# Patient Record
Sex: Female | Born: 1940 | Race: White | Hispanic: No | Marital: Married | Smoking: Never smoker
Health system: Southern US, Community
[De-identification: ages and names within clinical notes are randomized; demographics above are authoritative.]

## PROBLEM LIST (undated history)

## (undated) DIAGNOSIS — K5900 Constipation, unspecified: Secondary | ICD-10-CM

## (undated) DIAGNOSIS — N816 Rectocele: Secondary | ICD-10-CM

## (undated) DIAGNOSIS — I10 Essential (primary) hypertension: Secondary | ICD-10-CM

## (undated) HISTORY — DX: Constipation, unspecified: K59.00

## (undated) HISTORY — PX: TUBAL LIGATION: SHX77

## (undated) HISTORY — DX: Rectocele: N81.6

---

## 1997-10-14 ENCOUNTER — Other Ambulatory Visit: Admission: RE | Admit: 1997-10-14 | Discharge: 1997-10-14 | Payer: Self-pay | Admitting: *Deleted

## 1998-10-09 ENCOUNTER — Other Ambulatory Visit: Admission: RE | Admit: 1998-10-09 | Discharge: 1998-10-09 | Payer: Self-pay | Admitting: *Deleted

## 1999-10-22 ENCOUNTER — Other Ambulatory Visit: Admission: RE | Admit: 1999-10-22 | Discharge: 1999-10-22 | Payer: Self-pay | Admitting: *Deleted

## 2000-11-17 ENCOUNTER — Other Ambulatory Visit: Admission: RE | Admit: 2000-11-17 | Discharge: 2000-11-17 | Payer: Self-pay | Admitting: *Deleted

## 2011-07-05 ENCOUNTER — Encounter (INDEPENDENT_AMBULATORY_CARE_PROVIDER_SITE_OTHER): Payer: Self-pay | Admitting: *Deleted

## 2011-07-11 ENCOUNTER — Other Ambulatory Visit: Payer: Self-pay | Admitting: Adult Health

## 2011-07-11 ENCOUNTER — Other Ambulatory Visit (HOSPITAL_COMMUNITY)
Admission: RE | Admit: 2011-07-11 | Discharge: 2011-07-11 | Disposition: A | Discharge status: Home / Self Care | Payer: Medicare Other | Source: Ambulatory Visit | Attending: Obstetrics and Gynecology | Admitting: Obstetrics and Gynecology

## 2011-07-11 DIAGNOSIS — Z01419 Encounter for gynecological examination (general) (routine) without abnormal findings: Secondary | ICD-10-CM | POA: Insufficient documentation

## 2011-07-17 ENCOUNTER — Telehealth (INDEPENDENT_AMBULATORY_CARE_PROVIDER_SITE_OTHER): Payer: Self-pay | Admitting: *Deleted

## 2011-07-17 ENCOUNTER — Encounter (INDEPENDENT_AMBULATORY_CARE_PROVIDER_SITE_OTHER): Payer: Self-pay | Admitting: *Deleted

## 2011-07-17 ENCOUNTER — Other Ambulatory Visit (INDEPENDENT_AMBULATORY_CARE_PROVIDER_SITE_OTHER): Payer: Self-pay | Admitting: *Deleted

## 2011-07-17 DIAGNOSIS — Z1211 Encounter for screening for malignant neoplasm of colon: Secondary | ICD-10-CM

## 2011-07-17 MED ORDER — PEG-KCL-NACL-NASULF-NA ASC-C 100 G PO SOLR: 1.0000 | Freq: Once | ORAL | Status: DC

## 2011-07-17 NOTE — Telephone Encounter (Signed)
Patient needs movi prep 

## 2011-07-23 ENCOUNTER — Telehealth (INDEPENDENT_AMBULATORY_CARE_PROVIDER_SITE_OTHER): Payer: Self-pay | Admitting: *Deleted

## 2011-07-23 NOTE — Telephone Encounter (Signed)
PCP/Requesting MD: zack hall  Name & DOB: Terri Graham 2040/07/24     Procedure: tcs  Reason/Indication:  screening  Has patient had this procedure before?  no  If so, when, by whom and where?    Is there a family history of colon cancer?  no  Who?  What age when diagnosed?    Is patient diabetic?   no      Does patient have prosthetic heart valve?  no  Do you have a pacemaker?  no  Has patient had joint replacement within last 12 months?  no  Is patient on Coumadin, Plavix and/or Aspirin? yes  Medications: asa 81 mg daily, benicar 40 mg daily, crestor 40 mg daily, vitamins  Allergies: nkda  Medication Adjustment: asa 2 days  Procedure date & time: 08/22/11 at 1030

## 2011-07-25 NOTE — Telephone Encounter (Signed)
agree

## 2011-08-16 ENCOUNTER — Encounter (HOSPITAL_COMMUNITY): Payer: Self-pay | Admitting: Pharmacy Technician

## 2011-08-22 ENCOUNTER — Encounter (HOSPITAL_COMMUNITY)
Admission: RE | Disposition: A | Discharge status: Home / Self Care | Payer: Self-pay | Source: Ambulatory Visit | Attending: Internal Medicine

## 2011-08-22 ENCOUNTER — Telehealth (INDEPENDENT_AMBULATORY_CARE_PROVIDER_SITE_OTHER): Payer: Self-pay | Admitting: Internal Medicine

## 2011-08-22 ENCOUNTER — Ambulatory Visit (HOSPITAL_COMMUNITY)
Admission: RE | Admit: 2011-08-22 | Discharge: 2011-08-22 | Disposition: A | Discharge status: Home / Self Care | Payer: Medicare Other | Source: Ambulatory Visit | Attending: Internal Medicine | Admitting: Internal Medicine

## 2011-08-22 ENCOUNTER — Encounter (HOSPITAL_COMMUNITY): Payer: Self-pay | Admitting: *Deleted

## 2011-08-22 DIAGNOSIS — Z1211 Encounter for screening for malignant neoplasm of colon: Secondary | ICD-10-CM

## 2011-08-22 DIAGNOSIS — I10 Essential (primary) hypertension: Secondary | ICD-10-CM | POA: Insufficient documentation

## 2011-08-22 DIAGNOSIS — K644 Residual hemorrhoidal skin tags: Secondary | ICD-10-CM

## 2011-08-22 DIAGNOSIS — K5730 Diverticulosis of large intestine without perforation or abscess without bleeding: Secondary | ICD-10-CM | POA: Insufficient documentation

## 2011-08-22 DIAGNOSIS — Z79899 Other long term (current) drug therapy: Secondary | ICD-10-CM | POA: Insufficient documentation

## 2011-08-22 HISTORY — DX: Essential (primary) hypertension: I10

## 2011-08-22 HISTORY — PX: COLONOSCOPY: SHX5424

## 2011-08-22 SURGERY — COLONOSCOPY
Anesthesia: Moderate Sedation

## 2011-08-22 MED ORDER — MIDAZOLAM HCL 5 MG/5ML IJ SOLN
INTRAMUSCULAR | Status: AC
  Filled 2011-08-22: qty 10

## 2011-08-22 MED ORDER — STERILE WATER FOR IRRIGATION IR SOLN
Status: DC | PRN
  Administered 2011-08-22: 11:00:00

## 2011-08-22 MED ORDER — MEPERIDINE HCL 100 MG/ML IJ SOLN
INTRAMUSCULAR | Status: AC
  Filled 2011-08-22: qty 1

## 2011-08-22 MED ORDER — MEPERIDINE HCL 50 MG/ML IJ SOLN
INTRAMUSCULAR | Status: DC | PRN
  Administered 2011-08-22 (×2): 25 mg via INTRAVENOUS

## 2011-08-22 MED ORDER — ONDANSETRON HCL 4 MG/2ML IJ SOLN
INTRAMUSCULAR | Status: DC | PRN
  Administered 2011-08-22: 4 mg via INTRAVENOUS

## 2011-08-22 MED ORDER — ONDANSETRON HCL 4 MG/2ML IJ SOLN
INTRAMUSCULAR | Status: AC
  Filled 2011-08-22: qty 2

## 2011-08-22 MED ORDER — MEPERIDINE HCL 50 MG/ML IJ SOLN
INTRAMUSCULAR | Status: AC
  Filled 2011-08-22: qty 1

## 2011-08-22 MED ORDER — MIDAZOLAM HCL 5 MG/5ML IJ SOLN
INTRAMUSCULAR | Status: DC | PRN
  Administered 2011-08-22 (×2): 2 mg via INTRAVENOUS
  Administered 2011-08-22: 1 mg via INTRAVENOUS

## 2011-08-22 MED ORDER — SODIUM CHLORIDE 0.45 % IV SOLN
Freq: Once | INTRAVENOUS | Status: AC
  Administered 2011-08-22: 10:00:00 via INTRAVENOUS

## 2011-08-22 NOTE — H&P (Signed)
Terri Graham is an 71 y.o. female.   Chief Complaint: Patient is here for colonoscopy. HPI: Patient is 71 year old Caucasian female who is in for average risk screening colonoscopy. Patient denies abdominal pain change in her bowel habits or rectal bleeding. This is patient's first exam. Family history is negative for colorectal carcinoma.  Past Medical History  Diagnosis Date  . Hypertension     Past Surgical History  Procedure Date  . Tubal ligation     Family History  Problem Relation Age of Onset  . Colon cancer Neg Hx    Social History:  reports that she has never smoked. She does not have any smokeless tobacco history on file. She reports that she drinks alcohol. She reports that she does not use illicit drugs.  Allergies: No Known Allergies  Medications Prior to Admission  Medication Sig Dispense Refill  . aspirin EC 81 MG tablet Take 81 mg by mouth every evening.      . Calcium Carbonate-Vitamin D (CALCIUM + D PO) Take 1 tablet by mouth 2 (two) times daily.      Marland Kitchen CINNAMON PO Take 1 capsule by mouth daily.      . fish oil-omega-3 fatty acids 1000 MG capsule Take 1-2 g by mouth 2 (two) times daily. Takes 1 gm in the morning and 2 gm in the evening      . Multiple Vitamin (MULTIVITAMIN WITH MINERALS) TABS Take 1 tablet by mouth daily.      Marland Kitchen olmesartan (BENICAR) 40 MG tablet Take 40 mg by mouth every evening.      . peg 3350 powder (MOVIPREP) 100 G SOLR Take 1 kit (100 g total) by mouth once.  1 kit  0  . rosuvastatin (CRESTOR) 40 MG tablet Take 40 mg by mouth every evening.        No results found for this or any previous visit (from the past 48 hour(s)). No results found.  ROS  Blood pressure 153/85, pulse 100, temperature 97.8 F (36.6 C), temperature source Oral, resp. rate 18, height 5\' 2"  (1.575 m), weight 156 lb (70.761 kg), SpO2 97.00%. Physical Exam  Constitutional: She appears well-developed and well-nourished.  HENT:  Mouth/Throat: Oropharynx is  clear and moist.  Eyes: Conjunctivae are normal. No scleral icterus.  Neck: No thyromegaly present.  Cardiovascular: Normal rate, regular rhythm and normal heart sounds.   No murmur heard. Respiratory: Effort normal and breath sounds normal.  GI: Soft. She exhibits no distension and no mass. There is no tenderness.  Musculoskeletal: She exhibits no edema.  Lymphadenopathy:    She has no cervical adenopathy.  Skin: Skin is warm and dry.     Assessment/Plan Average risk screening colonoscopy.  Vaishnav Demartin U 08/22/2011, 10:52 AM

## 2011-08-22 NOTE — Telephone Encounter (Signed)
I called to check how patient was doing; she is still nauseated according to her husband. Script for Zofran 4 mg po q 6 prn called to [facility].

## 2011-08-22 NOTE — Discharge Instructions (Signed)
Resume usual medications. High fiber diet. No driving for 24 hours. Next screening exam in 10 years.Diverticulosis Diverticulosis is a common condition that develops when small pouches (diverticula) form in the wall of the colon. The risk of diverticulosis increases with age. It happens more often in people who eat a low-fiber diet. Most individuals with diverticulosis have no symptoms. Those individuals with symptoms usually experience abdominal pain, constipation, or loose stools (diarrhea). HOME CARE INSTRUCTIONS   Increase the amount of fiber in your diet as directed by your caregiver or dietician. This may reduce symptoms of diverticulosis.   Your caregiver may recommend taking a dietary fiber supplement.   Drink at least 6 to 8 glasses of water each day to prevent constipation.   Try not to strain when you have a bowel movement.   Your caregiver may recommend avoiding nuts and seeds to prevent complications, although this is still an uncertain benefit.   Only take over-the-counter or prescription medicines for pain, discomfort, or fever as directed by your caregiver.  FOODS WITH HIGH FIBER CONTENT INCLUDE:  Fruits. Apple, peach, pear, tangerine, raisins, prunes.   Vegetables. Brussels sprouts, asparagus, broccoli, cabbage, carrot, cauliflower, romaine lettuce, spinach, summer squash, tomato, winter squash, zucchini.   Starchy Vegetables. Baked beans, kidney beans, lima beans, split peas, lentils, potatoes (with skin).   Grains. Whole wheat bread, brown rice, bran flake cereal, plain oatmeal, white rice, shredded wheat, bran muffins.  SEEK IMMEDIATE MEDICAL CARE IF:   You develop increasing pain or severe bloating.   You have an oral temperature above 102 F (38.9 C), not controlled by medicine.   You develop vomiting or bowel movements that are bloody or black.  Document Released: 11/09/2003 Document Revised: 01/31/2011 Document Reviewed: 07/12/2009 [facility] Patient  Information 2012 [place], [place].Hemorrhoids Hemorrhoids are enlarged (dilated) veins around the rectum. There are 2 types of hemorrhoids, and the type of hemorrhoid is determined by its location. Internal hemorrhoids occur in the veins just inside the rectum.They are usually not painful, but they may bleed.However, they may poke through to the outside and become irritated and painful. External hemorrhoids involve the veins outside the anus and can be felt as a painful swelling or hard lump near the anus.They are often itchy and may crack and bleed. Sometimes clots will form in the veins. This makes them swollen and painful. These are called thrombosed hemorrhoids. CAUSES Causes of hemorrhoids include:  Pregnancy. This increases the pressure in the hemorrhoidal veins.   Constipation.   Straining to have a bowel movement.   Obesity.   Heavy lifting or other activity that caused you to strain.  TREATMENT Most of the time hemorrhoids improve in 1 to 2 weeks. However, if symptoms do not seem to be getting better or if you have a lot of rectal bleeding, your caregiver may perform a procedure to help make the hemorrhoids get smaller or remove them completely.Possible treatments include:  Rubber band ligation. A rubber band is placed at the base of the hemorrhoid to cut off the circulation.   Sclerotherapy. A chemical is injected to shrink the hemorrhoid.   Infrared light therapy. Tools are used to burn the hemorrhoid.   Hemorrhoidectomy. This is surgical removal of the hemorrhoid.  HOME CARE INSTRUCTIONS   Increase fiber in your diet. Ask your caregiver about using fiber supplements.   Drink enough water and fluids to keep your urine clear or pale yellow.   Exercise regularly.   Go to the bathroom when you have the  urge to have a bowel movement. Do not wait.   Avoid straining to have bowel movements.   Keep the anal area dry and clean.   Only take over-the-counter or prescription  medicines for pain, discomfort, or fever as directed by your caregiver.  If your hemorrhoids are thrombosed:  Take warm sitz baths for 20 to 30 minutes, 3 to 4 times per day.   If the hemorrhoids are very tender and swollen, place ice packs on the area as tolerated. Using ice packs between sitz baths may be helpful. Fill a plastic bag with ice. Place a towel between the bag of ice and your skin.   Medicated creams and suppositories may be used or applied as directed.   Do not use a donut-shaped pillow or sit on the toilet for long periods. This increases blood pooling and pain.  SEEK MEDICAL CARE IF:   You have increasing pain and swelling that is not controlled with your medicine.   You have uncontrolled bleeding.   You have difficulty or you are unable to have a bowel movement.   You have pain or inflammation outside the area of the hemorrhoids.   You have chills or an oral temperature above 102 F (38.9 C).  MAKE SURE YOU:   Understand these instructions.   Will watch your condition.   Will get help right away if you are not doing well or get worse.  Document Released: 02/09/2000 Document Revised: 01/31/2011 Document Reviewed: 06/16/2007 [employer] Patient Information 2012 [employer] Care After Read the instructions outlined below and refer to this sheet in the next few weeks. These discharge instructions provide you with general information on caring for yourself after you leave the hospital. Your doctor may also give you specific instructions. While your treatment has been planned according to the most current medical practices available, unavoidable complications occasionally occur. If you have any problems or questions after discharge, call your doctor. HOME CARE INSTRUCTIONS ACTIVITY:  You may resume your regular activity, but move at a slower pace for the next 24 hours.   Take frequent rest periods for the next 24 hours.   Walking will help get rid of  the air and reduce the bloated feeling in your belly (abdomen).   No driving for 24 hours (because of the medicine (anesthesia) used during the test).   You may shower.   Do not sign any important legal documents or operate any machinery for 24 hours (because of the anesthesia used during the test).  NUTRITION:  Drink plenty of fluids.   You may resume your normal diet as instructed by your doctor.   Begin with a light meal and progress to your normal diet. Heavy or fried foods are harder to digest and may make you feel sick to your stomach (nauseated).   Avoid alcoholic beverages for 24 hours or as instructed.  MEDICATIONS:  You may resume your normal medications unless your doctor tells you otherwise.  WHAT TO EXPECT TODAY:  Some feelings of bloating in the abdomen.   Passage of more gas than usual.   Spotting of blood in your stool or on the toilet paper.  IF YOU HAD POLYPS REMOVED DURING THE COLONOSCOPY:  No aspirin products for 7 days or as instructed.   No alcohol for 7 days or as instructed.   Eat a soft diet for the next 24 hours.  FINDING OUT THE RESULTS OF YOUR TEST Not all test results are available during your visit. If your  test results are not back during the visit, make an appointment with your caregiver to find out the results. Do not assume everything is normal if you have not heard from your caregiver or the medical facility. It is important for you to follow up on all of your test results.  SEEK IMMEDIATE MEDICAL CARE IF:  You have more than a spotting of blood in your stool.   Your belly is swollen (abdominal distention).   You are nauseated or vomiting.   You have a fever.   You have abdominal pain or discomfort that is severe or gets worse throughout the day.  Document Released: 09/26/2003 Document Revised: 01/31/2011 Document Reviewed: 09/24/2007 [facility] Patient Information 2012 [place], [place].Marland Kitchen

## 2011-08-22 NOTE — Op Note (Signed)
COLONOSCOPY PROCEDURE REPORT  PATIENT:  Terri Graham  MR#:  [id number] Birthdate:  08/19/1940, 71 y.o., female Endoscopist:  Dr. Malissa Hippo, MD Referred By:  Dr. Catalina Pizza, MD Procedure Date: 08/22/2011  Procedure:   Colonoscopy  Indications: Patient is 71 year old Caucasian female was undergoing average risk screening colonoscopy.  Informed Consent:  The procedure and risks were reviewed with the patient and informed consent was obtained.  Medications:  Demerol 50 mg IV Versed 5 mg IV Patient was also given Zofran 4 mg IV for nausea post procedure.  Description of procedure:  After a digital rectal exam was performed, that colonoscope was advanced from the anus through the rectum and colon to the area of the cecum, ileocecal valve and appendiceal orifice. The cecum was deeply intubated. These structures were well-seen and photographed for the record. From the level of the cecum and ileocecal valve, the scope was slowly and cautiously withdrawn. The mucosal surfaces were carefully surveyed utilizing scope tip to flexion to facilitate fold flattening as needed. The scope was pulled down into the rectum where a thorough exam including retroflexion was performed.  Findings:   Prep excellent. Few diverticula at sigmoid colon and one at hepatic flexure. No polyps or other mucosal abnormalities identified. Normal rectal mucosa. Small hemorrhoids below the dentate line.  Therapeutic/Diagnostic Maneuvers Performed:  None  Complications:  None  Cecal Withdrawal Time:  7 minutes  Impression:  Examination performed to cecum. Few diverticula at sigmoid colon and one at hepatic flexure. Small external hemorrhoids.  Recommendations:  Standard instructions given. Next screening exam in 10 years.  [name]  08/22/2011 11:33 AM  CC: Dr. Dwana Melena, MD & Dr. Bonnetta Barry ref. provider found

## 2011-08-22 NOTE — Progress Notes (Signed)
Pt vomited mod amt clear liquid. States she feels better afterward. Dr Karilyn Cota notified. States pt can be discharged.

## 2011-08-23 ENCOUNTER — Encounter (HOSPITAL_COMMUNITY): Payer: Self-pay | Admitting: Internal Medicine

## 2012-07-28 ENCOUNTER — Encounter: Payer: Self-pay | Admitting: *Deleted

## 2012-07-29 ENCOUNTER — Encounter: Payer: Self-pay | Admitting: Adult Health

## 2012-07-29 ENCOUNTER — Ambulatory Visit (INDEPENDENT_AMBULATORY_CARE_PROVIDER_SITE_OTHER): Payer: Medicare Other | Admitting: Adult Health

## 2012-07-29 VITALS — BP 140/72 | HR 74 | Ht 61.0 in | Wt 159.5 lb

## 2012-07-29 DIAGNOSIS — I10 Essential (primary) hypertension: Secondary | ICD-10-CM

## 2012-07-29 DIAGNOSIS — K5900 Constipation, unspecified: Secondary | ICD-10-CM

## 2012-07-29 DIAGNOSIS — Z1212 Encounter for screening for malignant neoplasm of rectum: Secondary | ICD-10-CM

## 2012-07-29 DIAGNOSIS — Z01419 Encounter for gynecological examination (general) (routine) without abnormal findings: Secondary | ICD-10-CM

## 2012-07-29 DIAGNOSIS — N816 Rectocele: Secondary | ICD-10-CM

## 2012-07-29 HISTORY — DX: Constipation, unspecified: K59.00

## 2012-07-29 HISTORY — DX: Rectocele: N81.6

## 2012-07-29 LAB — HEMOCCULT GUIAC POC 1CARD (OFFICE): Fecal Occult Blood, POC: NEGATIVE

## 2012-07-29 NOTE — Progress Notes (Signed)
Patient ID: Terri Graham, female   DOB: 1940-09-10, 72 y.o.   MRN: [id number] History of Present Illness: Terri Graham is a 72 year old white female married, in for her physical. She had a colonoscopy with Dr. Karilyn Cota last year.  Current Medications, Allergies, Past Medical History, Past Surgical History, Family History and Social History were reviewed in [employer] record.     Review of Systems: Patient denies any headaches, blurred vision, shortness of breath, chest pain, abdominal pain, problems with urination, or intercourse. She has constipation at times and uses miralax.No joint pain or mood changes.    Physical Exam:BP 140/72  Pulse 74  Ht 5\' 1"  (1.549 m)  Wt 159 lb 8 oz (72.349 kg)  BMI 30.15 kg/m2 General:  Well developed, well nourished, no acute distress Skin:  Warm and dry Neck:  Midline trachea, normal thyroid, no carotid bruits heard Lungs; Clear to auscultation bilaterally Breast:  No dominant palpable mass, retraction, or nipple discharge Cardiovascular: Regular rate and rhythm Abdomen:  Soft, non tender, no hepatosplenomegaly Pelvic:  External genitalia is normal in appearance for her age.  The vagina has decrease color, moisture and rugae.The cervix is atrophic.  Uterus is felt to be normal size, shape, and contour.  No        adnexal masses or tenderness noted. Rectal: Good sphincter tone, no polyps, or hemorrhoids felt.  Hemoccult negative.She has a rectocele Extremities:  No swelling  Noted, she has some spider veins Psych:  Alert and cooperative, seems happy   Impression: Yearly GYN exam- no pap Rectocele Hypertension Constipation Plan: Physical with pap and HPV next year Mammogram yearly Labs as per PCP

## 2012-07-29 NOTE — Patient Instructions (Addendum)
Physical and pap next year

## 2013-08-03 ENCOUNTER — Encounter: Payer: Self-pay | Admitting: Adult Health

## 2013-08-03 ENCOUNTER — Other Ambulatory Visit (HOSPITAL_COMMUNITY)
Admission: RE | Admit: 2013-08-03 | Discharge: 2013-08-03 | Disposition: A | Discharge status: Home / Self Care | Payer: Medicare Other | Source: Ambulatory Visit | Attending: Family Medicine | Admitting: Family Medicine

## 2013-08-03 ENCOUNTER — Ambulatory Visit (INDEPENDENT_AMBULATORY_CARE_PROVIDER_SITE_OTHER): Payer: Medicare Other | Admitting: Adult Health

## 2013-08-03 VITALS — BP 118/64 | HR 74 | Ht 62.0 in | Wt 160.0 lb

## 2013-08-03 DIAGNOSIS — Z1151 Encounter for screening for human papillomavirus (HPV): Secondary | ICD-10-CM | POA: Insufficient documentation

## 2013-08-03 DIAGNOSIS — Z1212 Encounter for screening for malignant neoplasm of rectum: Secondary | ICD-10-CM

## 2013-08-03 DIAGNOSIS — Z01419 Encounter for gynecological examination (general) (routine) without abnormal findings: Secondary | ICD-10-CM

## 2013-08-03 LAB — HEMOCCULT GUIAC POC 1CARD (OFFICE): Fecal Occult Blood, POC: NEGATIVE

## 2013-08-03 NOTE — Patient Instructions (Signed)
Physical in 2 years Mammogram yearly  Colonoscopy in 8 years Labs with PCP

## 2013-08-03 NOTE — Progress Notes (Signed)
Patient ID: Terri Graham, female   DOB: Apr 13, 1940, 73 y.o.   MRN: [id number] History of Present Illness: Terri Graham is a 73 year old white female in for pap and physical. She has no complaints.  Current Medications, Allergies, Past Medical History, Past Surgical History, Family History and Social History were reviewed in [employer] record.     Review of Systems: Patient denies any headaches, blurred vision, shortness of breath, chest pain, abdominal pain, problems with bowel movements, urination, or intercourse. 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    Physical Exam:BP 118/64  Pulse 74  Ht 5\' 2"  (1.575 m)  Wt 160 lb (72.576 kg)  BMI 29.26 kg/m2 General:  Well developed, well nourished, no acute distress Skin:  Warm and dry Neck:  Midline trachea, normal thyroid Lungs; Clear to auscultation bilaterally Breast:  No dominant palpable mass, retraction, or nipple discharge Cardiovascular: Regular rate and rhythm Abdomen:  Soft, non tender, no hepatosplenomegaly Pelvic:  External genitalia is normal in appearance for age.  The vagina has loss of color, moisture and rugae. The cervix is atrophic, pap performed with HPV.Marland Kitchen  Uterus is felt to be normal size, shape, and contour.  No   adnexal masses or tenderness noted. Rectal: Good sphincter tone, no polyps, or hemorrhoids felt.  Hemoccult negative.+hemocult. Extremities:  No swelling  noted Psych:  No mood changes, alert and cooperative,seems happy   Impression: Yearly gyn exam    Plan: Physical in 2 year Mammogram yearly Colonoscopy in 8 years Labs with PCP

## 2013-08-05 LAB — CYTOLOGY - PAP

## 2013-12-03 ENCOUNTER — Other Ambulatory Visit (HOSPITAL_COMMUNITY): Payer: Self-pay | Admitting: Internal Medicine

## 2013-12-03 DIAGNOSIS — Z13820 Encounter for screening for osteoporosis: Secondary | ICD-10-CM

## 2013-12-08 ENCOUNTER — Inpatient Hospital Stay (HOSPITAL_COMMUNITY): Admission: RE | Admit: 2013-12-08 | Payer: Medicare Other | Source: Ambulatory Visit

## 2013-12-27 ENCOUNTER — Encounter: Payer: Self-pay | Admitting: Adult Health

## 2015-03-10 DIAGNOSIS — R7301 Impaired fasting glucose: Secondary | ICD-10-CM | POA: Diagnosis not present

## 2015-03-10 DIAGNOSIS — I10 Essential (primary) hypertension: Secondary | ICD-10-CM | POA: Diagnosis not present

## 2015-03-10 DIAGNOSIS — E782 Mixed hyperlipidemia: Secondary | ICD-10-CM | POA: Diagnosis not present

## 2015-03-15 DIAGNOSIS — I10 Essential (primary) hypertension: Secondary | ICD-10-CM | POA: Diagnosis not present

## 2015-03-15 DIAGNOSIS — E782 Mixed hyperlipidemia: Secondary | ICD-10-CM | POA: Diagnosis not present

## 2015-03-15 DIAGNOSIS — R7301 Impaired fasting glucose: Secondary | ICD-10-CM | POA: Diagnosis not present

## 2015-03-15 DIAGNOSIS — R944 Abnormal results of kidney function studies: Secondary | ICD-10-CM | POA: Diagnosis not present

## 2015-08-10 ENCOUNTER — Ambulatory Visit (INDEPENDENT_AMBULATORY_CARE_PROVIDER_SITE_OTHER): Payer: Medicare HMO | Admitting: Adult Health

## 2015-08-10 ENCOUNTER — Encounter: Payer: Self-pay | Admitting: Adult Health

## 2015-08-10 VITALS — BP 164/90 | HR 78 | Ht 61.0 in | Wt 147.5 lb

## 2015-08-10 DIAGNOSIS — Z1211 Encounter for screening for malignant neoplasm of colon: Secondary | ICD-10-CM | POA: Diagnosis not present

## 2015-08-10 DIAGNOSIS — Z01419 Encounter for gynecological examination (general) (routine) without abnormal findings: Secondary | ICD-10-CM

## 2015-08-10 DIAGNOSIS — I10 Essential (primary) hypertension: Secondary | ICD-10-CM | POA: Diagnosis not present

## 2015-08-10 DIAGNOSIS — Z01411 Encounter for gynecological examination (general) (routine) with abnormal findings: Secondary | ICD-10-CM | POA: Diagnosis not present

## 2015-08-10 LAB — HEMOCCULT GUIAC POC 1CARD (OFFICE): Fecal Occult Blood, POC: NEGATIVE

## 2015-08-10 NOTE — Progress Notes (Signed)
Patient ID: Terri Graham, female   DOB: 1941-01-07, 75 y.o.   MRN: [id number] History of Present Illness: Terri Graham is a 75 year old white female, married in for a well woman gyn exam, she had a normal pap with negative HPV.She has not taken BP meds in about 5 months. PCP is Z. Hall.  Current Medications, Allergies, Past Medical History, Past Surgical History, Family History and Social History were [facility] in [facility].     Review of Systems: Patient denies any headaches, hearing loss, fatigue, blurred vision, shortness of breath, chest pain, abdominal pain, problems with bowel movements, urination, or intercourse(not often). No joint pain or mood swings.    Physical Exam:BP 164/90 mmHg  Pulse 78  Ht 5\' 1"  (1.549 m)  Wt 147 lb 8 oz (66.906 kg)  BMI 27.88 kg/m2 General:  Well developed, well nourished, no acute distress Skin:  Warm and dry Neck:  Midline trachea, normal thyroid, good ROM, no lymphadenopathy,No carotid bruits heard Lungs; Clear to auscultation bilaterally Breast:  No dominant palpable mass, retraction, or nipple discharge Cardiovascular: Regular rate and rhythm Abdomen:  Soft, non tender, no hepatosplenomegaly Pelvic:  External genitalia is normal in appearance, no lesions.  The vagina has decreased color, moisture and rugae. Urethra has no lesions or masses. The cervix is smooth.  Uterus is felt to be normal size, shape, and contour.  No adnexal masses or tenderness noted.Bladder is non tender, no masses felt. Rectal: Good sphincter tone, no polyps, or hemorrhoids felt.  Hemoccult negative. Extremities/musculoskeletal:  No swelling or varicosities noted, no clubbing or cyanosis Psych:  No mood changes, alert and cooperative,seems happy   Impression: Well woman gyn exam no pap Hypertension     Plan: Follow up with PCP about BP Physical in 2 years, no more paps Mammogram yearly Colonoscopy per GI Labs with PCP

## 2015-08-10 NOTE — Patient Instructions (Signed)
Physical in 2 years Mammogram yearly  Labs with PCP 

## 2015-09-13 DIAGNOSIS — E782 Mixed hyperlipidemia: Secondary | ICD-10-CM | POA: Diagnosis not present

## 2015-09-13 DIAGNOSIS — R7301 Impaired fasting glucose: Secondary | ICD-10-CM | POA: Diagnosis not present

## 2015-09-15 DIAGNOSIS — I10 Essential (primary) hypertension: Secondary | ICD-10-CM | POA: Diagnosis not present

## 2015-09-15 DIAGNOSIS — E782 Mixed hyperlipidemia: Secondary | ICD-10-CM | POA: Diagnosis not present

## 2015-09-15 DIAGNOSIS — R7301 Impaired fasting glucose: Secondary | ICD-10-CM | POA: Diagnosis not present

## 2016-02-09 DIAGNOSIS — Z1231 Encounter for screening mammogram for malignant neoplasm of breast: Secondary | ICD-10-CM | POA: Diagnosis not present

## 2016-02-09 DIAGNOSIS — Z803 Family history of malignant neoplasm of breast: Secondary | ICD-10-CM | POA: Diagnosis not present

## 2016-03-15 DIAGNOSIS — I10 Essential (primary) hypertension: Secondary | ICD-10-CM | POA: Diagnosis not present

## 2016-03-15 DIAGNOSIS — R7301 Impaired fasting glucose: Secondary | ICD-10-CM | POA: Diagnosis not present

## 2016-03-19 DIAGNOSIS — I10 Essential (primary) hypertension: Secondary | ICD-10-CM | POA: Diagnosis not present

## 2016-03-19 DIAGNOSIS — R7301 Impaired fasting glucose: Secondary | ICD-10-CM | POA: Diagnosis not present

## 2016-03-19 DIAGNOSIS — E782 Mixed hyperlipidemia: Secondary | ICD-10-CM | POA: Diagnosis not present

## 2016-03-19 DIAGNOSIS — R12 Heartburn: Secondary | ICD-10-CM | POA: Diagnosis not present

## 2016-05-15 DIAGNOSIS — E876 Hypokalemia: Secondary | ICD-10-CM | POA: Diagnosis not present

## 2016-05-15 DIAGNOSIS — Z0000 Encounter for general adult medical examination without abnormal findings: Secondary | ICD-10-CM | POA: Diagnosis not present

## 2016-05-15 DIAGNOSIS — E7800 Pure hypercholesterolemia, unspecified: Secondary | ICD-10-CM | POA: Diagnosis not present

## 2016-05-15 DIAGNOSIS — Z79899 Other long term (current) drug therapy: Secondary | ICD-10-CM | POA: Diagnosis not present

## 2016-05-15 DIAGNOSIS — R609 Edema, unspecified: Secondary | ICD-10-CM | POA: Diagnosis not present

## 2016-09-04 DIAGNOSIS — R7301 Impaired fasting glucose: Secondary | ICD-10-CM | POA: Diagnosis not present

## 2016-09-04 DIAGNOSIS — I10 Essential (primary) hypertension: Secondary | ICD-10-CM | POA: Diagnosis not present

## 2016-09-30 DIAGNOSIS — E782 Mixed hyperlipidemia: Secondary | ICD-10-CM | POA: Diagnosis not present

## 2016-09-30 DIAGNOSIS — I10 Essential (primary) hypertension: Secondary | ICD-10-CM | POA: Diagnosis not present

## 2016-09-30 DIAGNOSIS — R7301 Impaired fasting glucose: Secondary | ICD-10-CM | POA: Diagnosis not present

## 2016-09-30 DIAGNOSIS — Z6826 Body mass index (BMI) 26.0-26.9, adult: Secondary | ICD-10-CM | POA: Diagnosis not present

## 2016-09-30 DIAGNOSIS — K219 Gastro-esophageal reflux disease without esophagitis: Secondary | ICD-10-CM | POA: Diagnosis not present

## 2017-02-11 DIAGNOSIS — Z803 Family history of malignant neoplasm of breast: Secondary | ICD-10-CM | POA: Diagnosis not present

## 2017-02-11 DIAGNOSIS — Z1231 Encounter for screening mammogram for malignant neoplasm of breast: Secondary | ICD-10-CM | POA: Diagnosis not present

## 2017-04-02 DIAGNOSIS — E782 Mixed hyperlipidemia: Secondary | ICD-10-CM | POA: Diagnosis not present

## 2017-04-02 DIAGNOSIS — I10 Essential (primary) hypertension: Secondary | ICD-10-CM | POA: Diagnosis not present

## 2017-04-02 DIAGNOSIS — R7301 Impaired fasting glucose: Secondary | ICD-10-CM | POA: Diagnosis not present

## 2017-04-04 DIAGNOSIS — Z6828 Body mass index (BMI) 28.0-28.9, adult: Secondary | ICD-10-CM | POA: Diagnosis not present

## 2017-04-04 DIAGNOSIS — K219 Gastro-esophageal reflux disease without esophagitis: Secondary | ICD-10-CM | POA: Diagnosis not present

## 2017-04-04 DIAGNOSIS — R600 Localized edema: Secondary | ICD-10-CM | POA: Diagnosis not present

## 2017-04-04 DIAGNOSIS — D72819 Decreased white blood cell count, unspecified: Secondary | ICD-10-CM | POA: Diagnosis not present

## 2017-04-04 DIAGNOSIS — R7301 Impaired fasting glucose: Secondary | ICD-10-CM | POA: Diagnosis not present

## 2017-04-04 DIAGNOSIS — E785 Hyperlipidemia, unspecified: Secondary | ICD-10-CM | POA: Diagnosis not present

## 2017-08-13 ENCOUNTER — Ambulatory Visit: Payer: Medicare HMO | Admitting: Adult Health

## 2017-08-13 ENCOUNTER — Encounter (INDEPENDENT_AMBULATORY_CARE_PROVIDER_SITE_OTHER): Payer: Self-pay

## 2017-08-13 ENCOUNTER — Encounter: Payer: Self-pay | Admitting: Adult Health

## 2017-08-13 VITALS — BP 168/88 | HR 84 | Ht 60.5 in | Wt 157.5 lb

## 2017-08-13 DIAGNOSIS — N816 Rectocele: Secondary | ICD-10-CM

## 2017-08-13 DIAGNOSIS — Z01411 Encounter for gynecological examination (general) (routine) with abnormal findings: Secondary | ICD-10-CM | POA: Diagnosis not present

## 2017-08-13 DIAGNOSIS — I10 Essential (primary) hypertension: Secondary | ICD-10-CM

## 2017-08-13 DIAGNOSIS — Z1212 Encounter for screening for malignant neoplasm of rectum: Secondary | ICD-10-CM

## 2017-08-13 DIAGNOSIS — Z01419 Encounter for gynecological examination (general) (routine) without abnormal findings: Secondary | ICD-10-CM | POA: Insufficient documentation

## 2017-08-13 DIAGNOSIS — Z1211 Encounter for screening for malignant neoplasm of colon: Secondary | ICD-10-CM | POA: Diagnosis not present

## 2017-08-13 LAB — HEMOCCULT GUIAC POC 1CARD (OFFICE): Fecal Occult Blood, POC: NEGATIVE

## 2017-08-13 NOTE — Progress Notes (Signed)
Patient ID: Terri Graham, female   DOB: [birth year], 77 y.o.   MRN: [id number] History of Present Illness: Terri Graham is a 77 year old white female,married, PM in for a well woman gyn exam, she longer gets pap smears, last one was 08/03/13 and was negative for malignancy and HPV. She volunteers at [employer]. PCP is Dr Terri Graham.    Current Medications, Allergies, Past Medical History, Past Surgical History, Family History and Social History were reviewed in [facility] electronic medical record.     Review of Systems: Patient denies any headaches, hearing loss, fatigue, blurred vision, shortness of breath, chest pain, abdominal pain, problems with bowel movements, urination, or intercourse(not having sex). No joint pain or mood swings. She says BP good at home.   Physical Exam:BP (!) 168/88 (BP Location: Left Arm, Patient Position: Sitting, Cuff Size: Normal)   Pulse 84   Ht 5' 0.5" (1.537 m)   Wt 157 lb 8 oz (71.4 kg)   BMI 30.25 kg/m  General:  Well developed, well nourished, no acute distress Skin:  Warm and dry Neck:  Midline trachea, normal thyroid, good ROM, no lymphadenopathy, no carotid bruits heard Lungs; Clear to auscultation bilaterally Breast:  No dominant palpable mass, retraction, or nipple discharge Cardiovascular: Regular rate and rhythm Abdomen:  Soft, non tender, no hepatosplenomegaly Pelvic:  External genitalia is normal in appearance, no lesions.  The vagina is normal in appearance, for age with loss of color, moisture and rugae. Urethra has no lesions or masses. The cervix is atropic.  Uterus is felt to be normal size, shape, and contour.  No adnexal masses or tenderness noted.Bladder is non tender, no masses felt. Rectal: Good sphincter tone, no polyps, or hemorrhoids felt.  Hemoccult negative.+rectocele. Extremities/musculoskeletal:  No swelling or varicosities noted, no clubbing or cyanosis Psych:  No mood changes, alert and cooperative,seems happy PHQ  2 score 0.  Impression: 1. Encounter for well woman exam with routine gynecological exam   2. Screening for colorectal cancer   3. Essential hypertension   4. Rectocele       Plan: F/U with PCP on BP Physical in 2 years if desired Mammogram yearly Labs with PCP

## 2017-10-01 DIAGNOSIS — E785 Hyperlipidemia, unspecified: Secondary | ICD-10-CM | POA: Diagnosis not present

## 2017-10-01 DIAGNOSIS — D72819 Decreased white blood cell count, unspecified: Secondary | ICD-10-CM | POA: Diagnosis not present

## 2017-10-01 DIAGNOSIS — R7301 Impaired fasting glucose: Secondary | ICD-10-CM | POA: Diagnosis not present

## 2017-10-03 DIAGNOSIS — E782 Mixed hyperlipidemia: Secondary | ICD-10-CM | POA: Diagnosis not present

## 2017-10-03 DIAGNOSIS — D72819 Decreased white blood cell count, unspecified: Secondary | ICD-10-CM | POA: Diagnosis not present

## 2017-10-03 DIAGNOSIS — Z6828 Body mass index (BMI) 28.0-28.9, adult: Secondary | ICD-10-CM | POA: Diagnosis not present

## 2017-10-03 DIAGNOSIS — K219 Gastro-esophageal reflux disease without esophagitis: Secondary | ICD-10-CM | POA: Diagnosis not present

## 2017-10-03 DIAGNOSIS — R7301 Impaired fasting glucose: Secondary | ICD-10-CM | POA: Diagnosis not present

## 2017-10-03 DIAGNOSIS — R600 Localized edema: Secondary | ICD-10-CM | POA: Diagnosis not present

## 2018-02-12 DIAGNOSIS — Z803 Family history of malignant neoplasm of breast: Secondary | ICD-10-CM | POA: Diagnosis not present

## 2018-02-12 DIAGNOSIS — Z1231 Encounter for screening mammogram for malignant neoplasm of breast: Secondary | ICD-10-CM | POA: Diagnosis not present

## 2018-04-15 DIAGNOSIS — R7301 Impaired fasting glucose: Secondary | ICD-10-CM | POA: Diagnosis not present

## 2018-04-15 DIAGNOSIS — E782 Mixed hyperlipidemia: Secondary | ICD-10-CM | POA: Diagnosis not present

## 2018-04-15 DIAGNOSIS — E785 Hyperlipidemia, unspecified: Secondary | ICD-10-CM | POA: Diagnosis not present

## 2018-04-22 DIAGNOSIS — R7301 Impaired fasting glucose: Secondary | ICD-10-CM | POA: Diagnosis not present

## 2018-04-22 DIAGNOSIS — K219 Gastro-esophageal reflux disease without esophagitis: Secondary | ICD-10-CM | POA: Diagnosis not present

## 2018-04-22 DIAGNOSIS — D72819 Decreased white blood cell count, unspecified: Secondary | ICD-10-CM | POA: Diagnosis not present

## 2018-04-22 DIAGNOSIS — R600 Localized edema: Secondary | ICD-10-CM | POA: Diagnosis not present

## 2018-04-22 DIAGNOSIS — E782 Mixed hyperlipidemia: Secondary | ICD-10-CM | POA: Diagnosis not present

## 2018-06-12 DIAGNOSIS — Z0000 Encounter for general adult medical examination without abnormal findings: Secondary | ICD-10-CM | POA: Diagnosis not present

## 2018-10-14 DIAGNOSIS — R7301 Impaired fasting glucose: Secondary | ICD-10-CM | POA: Diagnosis not present

## 2018-10-14 DIAGNOSIS — E782 Mixed hyperlipidemia: Secondary | ICD-10-CM | POA: Diagnosis not present

## 2018-10-14 DIAGNOSIS — E785 Hyperlipidemia, unspecified: Secondary | ICD-10-CM | POA: Diagnosis not present

## 2018-10-21 DIAGNOSIS — D72819 Decreased white blood cell count, unspecified: Secondary | ICD-10-CM | POA: Diagnosis not present

## 2018-10-21 DIAGNOSIS — R7303 Prediabetes: Secondary | ICD-10-CM | POA: Diagnosis not present

## 2018-10-21 DIAGNOSIS — R7301 Impaired fasting glucose: Secondary | ICD-10-CM | POA: Diagnosis not present

## 2018-10-21 DIAGNOSIS — E782 Mixed hyperlipidemia: Secondary | ICD-10-CM | POA: Diagnosis not present

## 2018-10-21 DIAGNOSIS — E663 Overweight: Secondary | ICD-10-CM | POA: Diagnosis not present

## 2018-10-21 DIAGNOSIS — R600 Localized edema: Secondary | ICD-10-CM | POA: Diagnosis not present

## 2018-10-21 DIAGNOSIS — H40013 Open angle with borderline findings, low risk, bilateral: Secondary | ICD-10-CM | POA: Diagnosis not present

## 2018-10-21 DIAGNOSIS — K219 Gastro-esophageal reflux disease without esophagitis: Secondary | ICD-10-CM | POA: Diagnosis not present

## 2018-11-16 DIAGNOSIS — R69 Illness, unspecified: Secondary | ICD-10-CM | POA: Diagnosis not present

## 2018-11-21 ENCOUNTER — Emergency Department (HOSPITAL_COMMUNITY)
Admission: EM | Admit: 2018-11-21 | Discharge: 2018-11-22 | Disposition: A | Discharge status: Home / Self Care | Payer: Medicare HMO | Attending: Emergency Medicine | Admitting: Emergency Medicine

## 2018-11-21 ENCOUNTER — Encounter (HOSPITAL_COMMUNITY): Payer: Self-pay | Admitting: Emergency Medicine

## 2018-11-21 ENCOUNTER — Emergency Department (HOSPITAL_COMMUNITY): Payer: Medicare HMO

## 2018-11-21 ENCOUNTER — Other Ambulatory Visit: Payer: Self-pay

## 2018-11-21 DIAGNOSIS — R42 Dizziness and giddiness: Secondary | ICD-10-CM | POA: Diagnosis not present

## 2018-11-21 DIAGNOSIS — Z79899 Other long term (current) drug therapy: Secondary | ICD-10-CM | POA: Diagnosis not present

## 2018-11-21 DIAGNOSIS — I10 Essential (primary) hypertension: Secondary | ICD-10-CM | POA: Insufficient documentation

## 2018-11-21 DIAGNOSIS — I6523 Occlusion and stenosis of bilateral carotid arteries: Secondary | ICD-10-CM | POA: Diagnosis not present

## 2018-11-21 DIAGNOSIS — R030 Elevated blood-pressure reading, without diagnosis of hypertension: Secondary | ICD-10-CM

## 2018-11-21 DIAGNOSIS — R112 Nausea with vomiting, unspecified: Secondary | ICD-10-CM | POA: Diagnosis not present

## 2018-11-21 DIAGNOSIS — Z7982 Long term (current) use of aspirin: Secondary | ICD-10-CM | POA: Insufficient documentation

## 2018-11-21 LAB — COMPREHENSIVE METABOLIC PANEL
ALT: 39 U/L (ref 0–44)
AST: 32 U/L (ref 15–41)
Albumin: 4.6 g/dL (ref 3.5–5.0)
Alkaline Phosphatase: 62 U/L (ref 38–126)
Anion gap: 13 (ref 5–15)
BUN: 23 mg/dL (ref 8–23)
CO2: 21 mmol/L — ABNORMAL LOW (ref 22–32)
Calcium: 9.2 mg/dL (ref 8.9–10.3)
Chloride: 100 mmol/L (ref 98–111)
Creatinine, Ser: 0.78 mg/dL (ref 0.44–1.00)
GFR calc Af Amer: >60 mL/min (ref >60)
GFR calc non Af Amer: >60 mL/min (ref >60)
Glucose, Bld: 177 mg/dL — ABNORMAL HIGH (ref 70–99)
Potassium: 3.5 mmol/L (ref 3.5–5.1)
Sodium: 134 mmol/L — ABNORMAL LOW (ref 135–145)
Total Bilirubin: 0.5 mg/dL (ref 0.3–1.2)
Total Protein: 8.2 g/dL — ABNORMAL HIGH (ref 6.5–8.1)

## 2018-11-21 LAB — CBC
HCT: 42 % (ref 36.0–46.0)
Hemoglobin: 13.4 g/dL (ref 12.0–15.0)
MCH: 30.5 pg (ref 26.0–34.0)
MCHC: 31.9 g/dL (ref 30.0–36.0)
MCV: 95.5 fL (ref 80.0–100.0)
Platelets: 341 10*3/uL (ref 150–400)
RBC: 4.4 MIL/uL (ref 3.87–5.11)
RDW: 13.9 % (ref 11.5–15.5)
WBC: 8 10*3/uL (ref 4.0–10.5)
nRBC: 0 % (ref 0.0–0.2)

## 2018-11-21 LAB — URINALYSIS, ROUTINE W REFLEX MICROSCOPIC
Bacteria, UA: NONE SEEN
Bilirubin Urine: NEGATIVE
Glucose, UA: 150 mg/dL — AB
Hgb urine dipstick: NEGATIVE
Ketones, ur: NEGATIVE mg/dL
Leukocytes,Ua: NEGATIVE
Nitrite: NEGATIVE
Protein, ur: 100 mg/dL — AB
Specific Gravity, Urine: 1.017 (ref 1.005–1.030)
pH: 7 (ref 5.0–8.0)

## 2018-11-21 LAB — LIPASE, BLOOD: Lipase: 69 U/L — ABNORMAL HIGH (ref 11–51)

## 2018-11-21 MED ORDER — ONDANSETRON HCL 4 MG/2ML IJ SOLN
4.0000 mg | Freq: Once | INTRAMUSCULAR | Status: AC
  Administered 2018-11-21: 4 mg via INTRAVENOUS
  Filled 2018-11-21: qty 2

## 2018-11-21 MED ORDER — SODIUM CHLORIDE 0.9% FLUSH: 3.0000 mL | Freq: Once | INTRAVENOUS | Status: DC

## 2018-11-21 MED ORDER — PROMETHAZINE HCL 25 MG/ML IJ SOLN
12.5000 mg | Freq: Once | INTRAMUSCULAR | Status: AC
  Administered 2018-11-21: 12.5 mg via INTRAVENOUS
  Filled 2018-11-21: qty 1

## 2018-11-21 MED ORDER — SODIUM CHLORIDE 0.9 % IV BOLUS
500.0000 mL | Freq: Once | INTRAVENOUS | Status: AC
  Administered 2018-11-22: 500 mL via INTRAVENOUS

## 2018-11-21 MED ORDER — SODIUM CHLORIDE 0.9 % IV BOLUS
500.0000 mL | Freq: Once | INTRAVENOUS | Status: AC
  Administered 2018-11-21: 500 mL via INTRAVENOUS

## 2018-11-21 MED ORDER — MECLIZINE HCL 12.5 MG PO TABS
25.0000 mg | ORAL_TABLET | Freq: Once | ORAL | Status: AC
  Administered 2018-11-21: 25 mg via ORAL
  Filled 2018-11-21: qty 2

## 2018-11-21 NOTE — ED Triage Notes (Signed)
Pt C/O emesis that started around 1600 this afternoon. Pt denies pain. Denies fevers.

## 2018-11-21 NOTE — ED Notes (Signed)
Patient transported to CT 

## 2018-11-21 NOTE — ED Provider Notes (Signed)
[facility] EMERGENCY DEPARTMENT Provider Note   CSN: [id number] Arrival date & time: 11/21/18  2148     History   Chief Complaint Chief Complaint  Patient presents with  . Emesis    HPI Terri Graham is a 78 y.o. female with a history of HTN presenting with nausea, vomiting and mild lightheadedness which started fairly suddenly around 4 pm today. She describes several episodes of vomiting, most recently just prior to arrival.  She last ate lunch, a home cooked meal which was fresh.  She does report feeling a full sensation in the right ear today.  She denies abdominal pain and has had no diarrhea, also denies chest pain, sob, headache, no fevers or chills.  She has had no treatment prior to arrival.  Denies history of vertigo, no tinnitus.     The history is provided by the patient.    Past Medical History:  Diagnosis Date  . Constipation 07/29/2012  . Hypertension   . Rectocele 07/29/2012    Patient Active Problem List   Diagnosis Date Noted  . Screening for colorectal cancer 08/13/2017  . Encounter for well woman exam with routine gynecological exam 08/13/2017  . Hypertension 07/29/2012  . Rectocele 07/29/2012  . Constipation 07/29/2012    Past Surgical History:  Procedure Laterality Date  . COLONOSCOPY  08/22/2011   Procedure: COLONOSCOPY;  Surgeon: Malissa Hippo, MD;  Location: [facility];  Service: Endoscopy;  Laterality: N/A;  1030  . TUBAL LIGATION       OB History    Gravida  2   Para  2   Term  2   Preterm      AB      Living  2     SAB      TAB      Ectopic      Multiple      Live Births  2            Home Medications    Prior to Admission medications   Medication Sig Start Date End Date Taking? Authorizing Provider  aspirin EC 81 MG tablet Take 81 mg by mouth every evening.    [provider]  Calcium Carbonate-Vitamin D (CALCIUM + D PO) Take 1 tablet by mouth daily.     [provider]  CINNAMON PO  Take 1 capsule by mouth daily.    [provider]  fish oil-omega-3 fatty acids 1000 MG capsule Take 1-2 g by mouth daily. Takes 1 gm in the morning and 2 gm in the evening     [provider]  furosemide (LASIX) 20 MG tablet Take 20 mg by mouth. Takes 1 tab every other day. 07/17/15   [provider]  GARLIC PO Take by mouth daily.    [provider]  meclizine (ANTIVERT) 25 MG tablet Take 1 tablet (25 mg total) by mouth 3 (three) times daily as needed for dizziness. 11/22/18   Burgess Amor, PA-C  Multiple Vitamin (MULTIVITAMIN WITH MINERALS) TABS Take 1 tablet by mouth daily.    [provider]  potassium chloride (K-DUR) 10 MEQ tablet Take 10 mEq by mouth. Takes 1 tab every other day with Lasix 07/17/15   [provider]  promethazine (PHENERGAN) 12.5 MG tablet Take 1 tablet (12.5 mg total) by mouth every 6 (six) hours as needed for nausea or vomiting. 11/22/18   Lunna Vogelgesang, Raynelle Fanning, PA-C  rosuvastatin (CRESTOR) 40 MG tablet Take 40 mg by mouth  every evening.    [provider]    Family History Family History  Problem Relation Age of Onset  . Heart disease Mother   . Heart disease Father   . Cancer Sister        breast  . Heart disease Brother   . Diabetes Daughter   . Diabetes Maternal Grandmother   . Dementia Brother   . Colon cancer Neg Hx     Social History Social History   Tobacco Use  . Smoking status: Never Smoker  . Smokeless tobacco: Never Used  Substance Use Topics  . Alcohol use: Yes    Comment: wine socially  . Drug use: No     Allergies   Patient has no known allergies.   Review of Systems Review of Systems  Constitutional: Negative for chills and fever.  HENT: Negative for congestion, ear pain, hearing loss, rhinorrhea, sinus pain and sore throat.   Eyes: Negative.  Negative for visual disturbance.  Respiratory: Negative for chest tightness and shortness of breath.   Cardiovascular: Negative for chest  pain.  Gastrointestinal: Positive for nausea and vomiting. Negative for abdominal distention, abdominal pain and diarrhea.  Genitourinary: Negative.   Musculoskeletal: Negative for arthralgias, joint swelling and neck pain.  Skin: Negative.  Negative for rash and wound.  Neurological: Positive for dizziness. Negative for seizures, facial asymmetry, speech difficulty, weakness, numbness and headaches.  Psychiatric/Behavioral: Negative.      Physical Exam Updated Vital Signs BP (!) 173/80   Pulse 80   Temp 97.6 F (36.4 C) (Oral)   Resp 12   Wt 72.6 kg   SpO2 91%   BMI 30.73 kg/m   Physical Exam Vitals signs and nursing note reviewed.  Constitutional:      Appearance: She is well-developed.  HENT:     Head: Normocephalic and atraumatic.     Right Ear: Tympanic membrane normal.     Left Ear: Tympanic membrane normal.     Nose: Nose normal.  Eyes:     Extraocular Movements: Extraocular movements intact.     Pupils: Pupils are equal, round, and reactive to light.  Neck:     Musculoskeletal: Normal range of motion and neck supple.  Cardiovascular:     Rate and Rhythm: Normal rate and regular rhythm.     Heart sounds: Normal heart sounds.  Pulmonary:     Effort: Pulmonary effort is normal.  Abdominal:     Palpations: Abdomen is soft. There is no mass.     Tenderness: There is no abdominal tenderness. There is no guarding.  Musculoskeletal: Normal range of motion.  Lymphadenopathy:     Cervical: No cervical adenopathy.  Skin:    General: Skin is warm and dry.     Findings: No rash.  Neurological:     Mental Status: She is alert and oriented to person, place, and time.     GCS: GCS eye subscore is 4. GCS verbal subscore is 5. GCS motor subscore is 6.     Sensory: No sensory deficit.     Coordination: Finger-Nose-Finger Test and Heel to Bernice Test normal. Rapid alternating movements normal.     Gait: Gait normal.     Comments: Cranial nerves III-XII intact.  No pronator  drift.  Psychiatric:        Speech: Speech normal.        Behavior: Behavior normal.        Thought Content: Thought content normal.      ED  Treatments / Results  Labs (all labs ordered are listed, but only abnormal results are displayed) Labs Reviewed  LIPASE, BLOOD - Abnormal; Notable for the following components:      Result Value   Lipase 69 (*)    All other components within normal limits  COMPREHENSIVE METABOLIC PANEL - Abnormal; Notable for the following components:   Sodium 134 (*)    CO2 21 (*)    Glucose, Bld 177 (*)    Total Protein 8.2 (*)    All other components within normal limits  URINALYSIS, ROUTINE W REFLEX MICROSCOPIC - Abnormal; Notable for the following components:   APPearance HAZY (*)    Glucose, UA 150 (*)    Protein, ur 100 (*)    All other components within normal limits  CBC  TROPONIN I (HIGH SENSITIVITY)    EKG EKG Interpretation  Date/Time:  Saturday November 21 2018 23:15:07 EDT Ventricular Rate:  78 PR Interval:    QRS Duration: 89 QT Interval:  400 QTC Calculation: 456 R Axis:   -31 Text Interpretation:  Sinus rhythm Low voltage, precordial leads Left ventricular hypertrophy Confirmed by Thayer Jew [phone]) on 11/21/2018 11:30:25 PM   Radiology Ct Head Wo Contrast  Result Date: 11/21/2018 CLINICAL DATA:  Emesis beginning at 4 p.m. EXAM: CT HEAD WITHOUT CONTRAST TECHNIQUE: Contiguous axial images were obtained from the base of the skull through the vertex without intravenous contrast. COMPARISON:  None. FINDINGS: Brain: Likely remote infarct or prominent perivascular space in posterior left putamen. No evidence of acute infarction, hemorrhage, hydrocephalus, extra-axial collection or mass lesion/mass effect. Symmetric prominence of the ventricles, cisterns and sulci compatible with parenchymal volume loss. Patchy areas of white matter hypoattenuation are most compatible with chronic microvascular angiopathy. Vascular: Atherosclerotic  calcification of the carotid siphons and intradural vertebral arteries. No hyperdense vessel. Skull: No calvarial fracture or suspicious osseous lesion. No scalp swelling or hematoma. Sinuses/Orbits: Trace right mastoid effusion. Paranasal sinuses and mastoid air cells are otherwise predominantly clear. Included orbital structures are unremarkable. 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 No acute intracranial abnormality. Trace right mastoid effusion. Electronically Signed   By: Lovena Le M.D.   On: 11/21/2018 23:40    Procedures Procedures (including critical care time)  Medications Ordered in ED Medications  sodium chloride flush (NS) 0.9 % injection 3 mL (3 mLs Intravenous Not Given 11/21/18 2226)  sodium chloride 0.9 % bolus 500 mL (0 mLs Intravenous Stopped 11/21/18 2333)  ondansetron (ZOFRAN) injection 4 mg (4 mg Intravenous Given 11/21/18 2225)  promethazine (PHENERGAN) injection 12.5 mg (12.5 mg Intravenous Given 11/21/18 2300)  meclizine (ANTIVERT) tablet 25 mg (25 mg Oral Given 11/21/18 2338)  sodium chloride 0.9 % bolus 500 mL (500 mLs Intravenous New Bag/Given 11/22/18 0028)     Initial Impression / Assessment and Plan / ED Course  I have reviewed the triage vital signs and the nursing notes.  Pertinent labs & imaging results that were available during my care of the patient were reviewed by me and considered in my medical decision making (see chart for details).        Pt was given IV fluids given tachycardia, also given zofran with transient improvement in nausea.  Added phenergan with resolution of n/v.  Also given meclizine and dizziness abated.  Pt ambulatory in dept without deficits. Normal neuro exam, no concern for central source of dizziness. No nystagmus. Pt responded completely to meclizine. Neuro exam normal.  Will plan home with antivert and phenergan, caution regarding sedation.  Plan close f/u with pcp this  week.    Final Clinical Impressions(s) / ED Diagnoses   Final diagnoses:  Vertigo  Non-intractable vomiting with nausea, unspecified vomiting type    ED Discharge Orders         Ordered    meclizine (ANTIVERT) 25 MG tablet  3 times daily PRN     11/22/18 0028    promethazine (PHENERGAN) 12.5 MG tablet  Every 6 hours PRN     11/22/18 0028           Burgess Amordol, Tj Kitchings, PA-C 11/22/18 [id number]    Wilkie AyeHorton, Mayer Maskerourtney F, MD 11/22/18 [phone]

## 2018-11-22 LAB — TROPONIN I (HIGH SENSITIVITY): Troponin I (High Sensitivity): 6 ng/L (ref <18)

## 2018-11-22 MED ORDER — PROMETHAZINE HCL 12.5 MG PO TABS: 12.5000 mg | ORAL_TABLET | Freq: Four times a day (QID) | ORAL | 0 refills | Status: DC | PRN

## 2018-11-22 MED ORDER — MECLIZINE HCL 25 MG PO TABS: 25.0000 mg | ORAL_TABLET | Freq: Three times a day (TID) | ORAL | 0 refills | Status: DC | PRN

## 2018-11-22 NOTE — Discharge Instructions (Signed)
Take the medicines prescribed if needed for treatment of your symptoms if they resume.  Vertigo may linger for several days or longer. Meclizine and phenergan will help with dizziness and nausea.  These medicines can make you drowsy so use caution while taking.    Your blood pressure was elevated her tonight.  Call Dr. Nevada Crane for a recheck appointment this week for a recheck of your symptoms and for a blood pressure recheck.

## 2018-11-24 DIAGNOSIS — R42 Dizziness and giddiness: Secondary | ICD-10-CM | POA: Diagnosis not present

## 2018-11-24 DIAGNOSIS — R5383 Other fatigue: Secondary | ICD-10-CM | POA: Diagnosis not present

## 2018-11-24 DIAGNOSIS — R030 Elevated blood-pressure reading, without diagnosis of hypertension: Secondary | ICD-10-CM | POA: Diagnosis not present

## 2018-11-24 DIAGNOSIS — R112 Nausea with vomiting, unspecified: Secondary | ICD-10-CM | POA: Diagnosis not present

## 2019-02-23 DIAGNOSIS — Z1231 Encounter for screening mammogram for malignant neoplasm of breast: Secondary | ICD-10-CM | POA: Diagnosis not present

## 2019-03-11 DIAGNOSIS — N6001 Solitary cyst of right breast: Secondary | ICD-10-CM | POA: Diagnosis not present

## 2019-03-11 DIAGNOSIS — R922 Inconclusive mammogram: Secondary | ICD-10-CM | POA: Diagnosis not present

## 2020-04-18 ENCOUNTER — Encounter (HOSPITAL_COMMUNITY): Payer: Self-pay

## 2020-04-18 ENCOUNTER — Encounter (HOSPITAL_COMMUNITY)
Admission: RE | Admit: 2020-04-18 | Discharge: 2020-04-18 | Disposition: A | Discharge status: Home / Self Care | Payer: Medicare HMO | Source: Ambulatory Visit | Attending: Ophthalmology | Admitting: Ophthalmology

## 2020-04-18 ENCOUNTER — Other Ambulatory Visit: Payer: Self-pay

## 2020-04-18 NOTE — H&P (Signed)
Surgical History & Physical  Patient Name: Terri Graham DOB: 1940-05-17  Surgery: Cataract extraction with intraocular lens implant phacoemulsification; Right Eye  Surgeon: Fabio Pierce MD Surgery Date:  04/24/2020 Pre-Op Date:  04/18/2020  HPI: A 46 Yr. old female patient is referred by Dr Daphine Deutscher for cataract eval. 1. The patient complains of difficulty when driving due to glare from headlights or sun, which began many years ago. Both eyes are affected. The episode is gradual. The condition's severity increased since last visit. This is negatively affecting her quality of life. HPI Completed by Dr. Fabio Pierce  Medical History: Dry Eyes Cataracts High Blood Pressure Hypercholesterolemia  Review of Systems Allergic/Immunologic Seasonal Allergies All recorded systems are negative except as noted above.  Social   Never smoked   Medication Multivitamin, Rosuvastatin,   Sx/Procedures Tubal Ligation,   Drug Allergies  Seasonal allergies,   History & Physical: Heent: Cataract, Right eye NECK: supple without bruits LUNGS: lungs clear to auscultation CV: regular rate and rhythm Abdomen: soft and non-tender  Impression & Plan: Assessment: 1.  COMBINED FORMS AGE RELATED CATARACT; Both Eyes (H25.813) 2.  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  BLEPHARITIS; Right Upper Lid, Right Lower Lid, Left Upper Lid, Left Lower Lid (H01.001, H01.002,H01.004,H01.005) 4.  Pinguecula; Both Eyes (H11.153)  Plan: 1.  Cataract accounts for the patient's decreased vision. This visual impairment is not correctable with a tolerable change in glasses or contact lenses. Cataract surgery with an implantation of a new lens should significantly improve the visual and functional status of the patient. Discussed all risks, benefits, alternatives, and potential complications. Discussed the procedures and recovery. Patient desires to have surgery. A-scan ordered and performed today for intra-ocular lens  calculations. The surgery will be performed in order to improve vision for driving, reading, and for eye examinations. Recommend phacoemulsification with intra-ocular lens. Recommend Dextenza for post-operative pain and inflammation. Right Eye worse - first. Dilates well - shugarcaine by protocol. 2.  3.  recommend regular lid cleaning. 4.  Observe; Artificial tears as needed for irritation.

## 2020-04-21 ENCOUNTER — Other Ambulatory Visit: Payer: Self-pay

## 2020-04-21 ENCOUNTER — Other Ambulatory Visit (HOSPITAL_COMMUNITY)
Admission: RE | Admit: 2020-04-21 | Discharge: 2020-04-21 | Disposition: A | Discharge status: Home / Self Care | Payer: Medicare HMO | Source: Ambulatory Visit | Attending: Ophthalmology | Admitting: Ophthalmology

## 2020-04-21 DIAGNOSIS — Z20822 Contact with and (suspected) exposure to covid-19: Secondary | ICD-10-CM | POA: Diagnosis not present

## 2020-04-21 DIAGNOSIS — Z01812 Encounter for preprocedural laboratory examination: Secondary | ICD-10-CM | POA: Insufficient documentation

## 2020-04-21 LAB — SARS CORONAVIRUS 2 (TAT 6-24 HRS): SARS Coronavirus 2: NEGATIVE

## 2020-04-24 ENCOUNTER — Encounter (HOSPITAL_COMMUNITY)
Admission: RE | Disposition: A | Discharge status: Home / Self Care | Payer: Self-pay | Source: Home / Self Care | Attending: Ophthalmology

## 2020-04-24 ENCOUNTER — Ambulatory Visit (HOSPITAL_COMMUNITY): Payer: Medicare HMO | Admitting: Anesthesiology

## 2020-04-24 ENCOUNTER — Encounter (HOSPITAL_COMMUNITY): Payer: Self-pay | Admitting: Ophthalmology

## 2020-04-24 ENCOUNTER — Other Ambulatory Visit: Payer: Self-pay

## 2020-04-24 ENCOUNTER — Ambulatory Visit (HOSPITAL_COMMUNITY)
Admission: RE | Admit: 2020-04-24 | Discharge: 2020-04-24 | Disposition: A | Discharge status: Home / Self Care | Payer: Medicare HMO | Attending: Ophthalmology | Admitting: Ophthalmology

## 2020-04-24 DIAGNOSIS — H0100B Unspecified blepharitis left eye, upper and lower eyelids: Secondary | ICD-10-CM | POA: Diagnosis not present

## 2020-04-24 DIAGNOSIS — H5203 Hypermetropia, bilateral: Secondary | ICD-10-CM | POA: Diagnosis not present

## 2020-04-24 DIAGNOSIS — H25813 Combined forms of age-related cataract, bilateral: Secondary | ICD-10-CM | POA: Diagnosis not present

## 2020-04-24 DIAGNOSIS — H25811 Combined forms of age-related cataract, right eye: Secondary | ICD-10-CM | POA: Diagnosis present

## 2020-04-24 DIAGNOSIS — Z79899 Other long term (current) drug therapy: Secondary | ICD-10-CM | POA: Diagnosis not present

## 2020-04-24 DIAGNOSIS — H11153 Pinguecula, bilateral: Secondary | ICD-10-CM | POA: Insufficient documentation

## 2020-04-24 DIAGNOSIS — H0100A Unspecified blepharitis right eye, upper and lower eyelids: Secondary | ICD-10-CM | POA: Insufficient documentation

## 2020-04-24 HISTORY — PX: CATARACT EXTRACTION W/PHACO: SHX586

## 2020-04-24 SURGERY — PHACOEMULSIFICATION, CATARACT, WITH IOL INSERTION
Anesthesia: Monitor Anesthesia Care | Site: Eye | Laterality: Right

## 2020-04-24 MED ORDER — LIDOCAINE HCL 3.5 % OP GEL
1.0000 "application " | Freq: Once | OPHTHALMIC | Status: AC
  Administered 2020-04-24: 1 via OPHTHALMIC

## 2020-04-24 MED ORDER — NEOMYCIN-POLYMYXIN-DEXAMETH 3.5-10000-0.1 OP SUSP
OPHTHALMIC | Status: DC | PRN
  Administered 2020-04-24: 1 [drp] via OPHTHALMIC

## 2020-04-24 MED ORDER — BSS IO SOLN
INTRAOCULAR | Status: DC | PRN
  Administered 2020-04-24: 15 mL via INTRAOCULAR

## 2020-04-24 MED ORDER — PROVISC 10 MG/ML IO SOLN
INTRAOCULAR | Status: DC | PRN
  Administered 2020-04-24: 0.85 mL via INTRAOCULAR

## 2020-04-24 MED ORDER — MIDAZOLAM HCL 2 MG/2ML IJ SOLN
INTRAMUSCULAR | Status: DC | PRN
  Administered 2020-04-24: 1 mg via INTRAVENOUS

## 2020-04-24 MED ORDER — EPINEPHRINE PF 1 MG/ML IJ SOLN
INTRAOCULAR | Status: DC | PRN
  Administered 2020-04-24: 500 mL

## 2020-04-24 MED ORDER — SODIUM HYALURONATE 23 MG/ML IO SOLN
INTRAOCULAR | Status: DC | PRN
  Administered 2020-04-24: 0.6 mL via INTRAOCULAR

## 2020-04-24 MED ORDER — STERILE WATER FOR IRRIGATION IR SOLN
Status: DC | PRN
  Administered 2020-04-24: 250 mL

## 2020-04-24 MED ORDER — LIDOCAINE HCL (PF) 1 % IJ SOLN
INTRAOCULAR | Status: DC | PRN
  Administered 2020-04-24: 1 mL via OPHTHALMIC

## 2020-04-24 MED ORDER — MIDAZOLAM HCL 2 MG/2ML IJ SOLN
INTRAMUSCULAR | Status: AC
  Filled 2020-04-24: qty 2

## 2020-04-24 MED ORDER — POVIDONE-IODINE 5 % OP SOLN
OPHTHALMIC | Status: DC | PRN
  Administered 2020-04-24: 1 via OPHTHALMIC

## 2020-04-24 MED ORDER — PHENYLEPHRINE HCL 2.5 % OP SOLN
1.0000 [drp] | OPHTHALMIC | Status: AC | PRN
  Administered 2020-04-24 (×3): 1 [drp] via OPHTHALMIC

## 2020-04-24 MED ORDER — SODIUM CHLORIDE 0.9% FLUSH
INTRAVENOUS | Status: DC | PRN
  Administered 2020-04-24: 5 mL via INTRAVENOUS

## 2020-04-24 MED ORDER — TETRACAINE HCL 0.5 % OP SOLN
1.0000 [drp] | OPHTHALMIC | Status: AC | PRN
  Administered 2020-04-24 (×3): 1 [drp] via OPHTHALMIC

## 2020-04-24 MED ORDER — TROPICAMIDE 1 % OP SOLN
1.0000 [drp] | Freq: Once | OPHTHALMIC | Status: AC
  Administered 2020-04-24 (×3): 1 [drp] via OPHTHALMIC

## 2020-04-24 SURGICAL SUPPLY — 13 items
CLOTH BEACON ORANGE TIMEOUT ST (SAFETY) ×1 IMPLANT
EYE SHIELD UNIVERSAL CLEAR (GAUZE/BANDAGES/DRESSINGS) ×1 IMPLANT
GLOVE SURG UNDER POLY LF SZ6.5 (GLOVE) ×1 IMPLANT
GLOVE SURG UNDER POLY LF SZ7 (GLOVE) ×2 IMPLANT
NDL HYPO 18GX1.5 BLUNT FILL (NEEDLE) IMPLANT
NEEDLE HYPO 18GX1.5 BLUNT FILL (NEEDLE) ×2 IMPLANT
PAD ARMBOARD 7.5X6 YLW CONV (MISCELLANEOUS) ×1 IMPLANT
SYR TB 1ML LL NO SAFETY (SYRINGE) ×1 IMPLANT
TAPE SURG TRANSPORE 1 IN (GAUZE/BANDAGES/DRESSINGS) IMPLANT
TAPE SURGICAL TRANSPORE 1 IN (GAUZE/BANDAGES/DRESSINGS) ×2
Tecnis IOL (Intraocular Lens) ×2 IMPLANT
VISCOELASTIC ADDITIONAL (OPHTHALMIC RELATED) IMPLANT
WATER STERILE IRR 250ML POUR (IV SOLUTION) ×2 IMPLANT

## 2020-04-24 NOTE — Interval H&P Note (Signed)
History and Physical Interval Note:  04/24/2020 9:24 AM  Terri Graham  has presented today for surgery, with the diagnosis of Nuclear sclerotic cataract - Right eye.  The various methods of treatment have been discussed with the patient and family. After consideration of risks, benefits and other options for treatment, the patient has consented to  Procedure(s) with comments: CATARACT EXTRACTION PHACO AND INTRAOCULAR LENS PLACEMENT (IOC) (Right) - CDE:  as a surgical intervention.  The patient's history has been reviewed, patient examined, no change in status, stable for surgery.  I have reviewed the patient's chart and labs.  Questions were answered to the patient's satisfaction.     Fabio Pierce

## 2020-04-24 NOTE — Discharge Instructions (Signed)
Please discharge patient when stable, will follow up today with Dr. Furqan Gosselin at the [facility] [place] office immediately following discharge.  Leave shield in place until visit.  All paperwork with discharge instructions will be given at the office.  [facility] Address:  [address]  [place], [place] [address]             Monitored Anesthesia Care, Care After This sheet gives you information about how to care for yourself after your procedure. Your health care provider may also give you more specific instructions. If you have problems or questions, contact your health care provider. What can I expect after the procedure? After the procedure, it is common to have:  Tiredness.  Forgetfulness about what happened after the procedure.  Impaired judgment for important decisions.  Nausea or vomiting.  Some difficulty with balance. Follow these instructions at home: For the time period you were told by your health care provider:  Rest as needed.  Do not participate in activities where you could fall or become injured.  Do not drive or use machinery.  Do not drink alcohol.  Do not take sleeping pills or medicines that cause drowsiness.  Do not make important decisions or sign legal documents.  Do not take care of children on your own.      Eating and drinking  Follow the diet that is recommended by your health care provider.  Drink enough fluid to keep your urine pale yellow.  If you vomit: ? Drink water, juice, or soup when you can drink without vomiting. ? Make sure you have little or no nausea before eating solid foods. General instructions  Have a responsible adult stay with you for the time you are told. It is important to have someone help care for you until you are awake and alert.  Take over-the-counter and prescription medicines only as told by your health care provider.  If you have sleep apnea, surgery and certain  medicines can increase your risk for breathing problems. Follow instructions from your health care provider about wearing your sleep device: ? Anytime you are sleeping, including during daytime naps. ? While taking prescription pain medicines, sleeping medicines, or medicines that make you drowsy.  Avoid smoking.  Keep all follow-up visits as told by your health care provider. This is important. Contact a health care provider if:  You keep feeling nauseous or you keep vomiting.  You feel light-headed.  You are still sleepy or having trouble with balance after 24 hours.  You develop a rash.  You have a fever.  You have redness or swelling around the IV site. Get help right away if:  You have trouble breathing.  You have new-onset confusion at home. Summary  For several hours after your procedure, you may feel tired. You may also be forgetful and have poor judgment.  Have a responsible adult stay with you for the time you are told. It is important to have someone help care for you until you are awake and alert.  Rest as told. Do not drive or operate machinery. Do not drink alcohol or take sleeping pills.  Get help right away if you have trouble breathing, or if you suddenly become confused. This information is not intended to replace advice given to you by your health care provider. Make sure you discuss any questions you have with your health care provider. Document Revised: 10/28/2019 Document Reviewed: 01/14/2019 Elsevier Patient Education  2021 Elsevier Inc.  

## 2020-04-24 NOTE — Anesthesia Procedure Notes (Signed)
Procedure Name: MAC Date/Time: 04/24/2020 9:32 AM Performed by: Orlie Dakin, CRNA Pre-anesthesia Checklist: Patient identified, Emergency Drugs available, Suction available and Patient being monitored Patient Re-evaluated:Patient Re-evaluated prior to induction Oxygen Delivery Method: Nasal cannula Placement Confirmation: positive ETCO2

## 2020-04-24 NOTE — Anesthesia Preprocedure Evaluation (Signed)
Anesthesia Evaluation  Patient identified by MRN, date of birth, ID band Patient awake    Reviewed: Allergy & Precautions, NPO status , Patient's Chart, lab work & pertinent test results  History of Anesthesia Complications Negative for: history of anesthetic complications  Airway Mallampati: II  TM Distance: >3 FB Neck ROM: Full    Dental  (+) Dental Advisory Given, Teeth Intact   Pulmonary neg pulmonary ROS,    Pulmonary exam normal breath sounds clear to auscultation       Cardiovascular Exercise Tolerance: Good hypertension (not on meds), Normal cardiovascular exam Rhythm:Regular Rate:Normal     Neuro/Psych negative neurological ROS     GI/Hepatic negative GI ROS, Neg liver ROS,   Endo/Other  negative endocrine ROS  Renal/GU negative Renal ROS     Musculoskeletal negative musculoskeletal ROS (+)   Abdominal   Peds  Hematology negative hematology ROS (+)   Anesthesia Other Findings   Reproductive/Obstetrics negative OB ROS                             Anesthesia Physical Anesthesia Plan  ASA: II  Anesthesia Plan: MAC   Post-op Pain Management:    Induction: Intravenous  PONV Risk Score and Plan:   Airway Management Planned: Natural Airway  Additional Equipment:   Intra-op Plan:   Post-operative Plan:   Informed Consent: I have reviewed the patients History and Physical, chart, labs and discussed the procedure including the risks, benefits and alternatives for the proposed anesthesia with the patient or authorized representative who has indicated his/her understanding and acceptance.     Dental advisory given  Plan Discussed with: CRNA and Surgeon  Anesthesia Plan Comments:         Anesthesia Quick Evaluation

## 2020-04-24 NOTE — Transfer of Care (Signed)
Immediate Anesthesia Transfer of Care Note  Patient: Terri Graham [name]  Procedure(s) Performed: CATARACT EXTRACTION PHACO AND INTRAOCULAR LENS PLACEMENT (IOC) (Right Eye)  Patient Location: Short Stay  Anesthesia Type:MAC  Level of Consciousness: awake, alert  and oriented  Airway & Oxygen Therapy: Patient Spontanous Breathing  Post-op Assessment: Report given to RN, Post -op Vital signs reviewed and stable and Patient moving all extremities X 4  Post vital signs: Reviewed and stable  Last Vitals:  Vitals Value Taken Time  BP    Temp    Pulse    Resp    SpO2      Last Pain:  Vitals:   04/24/20 0908  TempSrc: Oral  PainSc: 0-No pain         Complications: No complications documented.

## 2020-04-24 NOTE — Anesthesia Postprocedure Evaluation (Signed)
Anesthesia Post Note  Patient: Terri Graham [facility]  Procedure(s) Performed: CATARACT EXTRACTION PHACO AND INTRAOCULAR LENS PLACEMENT (IOC) (Right Eye)  Patient location during evaluation: Phase II Anesthesia Type: MAC Level of consciousness: awake and alert and oriented Pain management: pain level controlled Vital Signs Assessment: post-procedure vital signs reviewed and stable Respiratory status: spontaneous breathing, nonlabored ventilation and respiratory function stable Cardiovascular status: blood pressure returned to baseline and stable Postop Assessment: no apparent nausea or vomiting Anesthetic complications: no   No complications documented.   Last Vitals:  Vitals:   04/24/20 0908  BP: (!) 191/95  Pulse: 70  Resp: 17  Temp: 36.6 C  SpO2: 96%    Last Pain:  Vitals:   04/24/20 0908  TempSrc: Oral  PainSc: 0-No pain                 Orlie Dakin

## 2020-04-24 NOTE — Op Note (Signed)
Date of procedure: 04/24/20  Pre-operative diagnosis:  Visually significant combined form age-related cataract, Right Eye (H25.811)  Post-operative diagnosis:  Visually significant combined form age-related cataract, Right Eye (H25.811)  Procedure: Removal of cataract via phacoemulsification and insertion of intra-ocular lens Wynetta Emery and Howells  +19.5D into the capsular bag of the Right Eye  Attending surgeon: Gerda Diss. Breland Trouten, MD, MA  Anesthesia: MAC, Topical Akten  Complications: None  Estimated Blood Loss: <2m (minimal)  Specimens: None  Implants: As above  Indications:  Visually significant age-related cataract, Right Eye  Procedure:  The patient was seen and identified in the pre-operative area. The operative eye was identified and dilated.  The operative eye was marked.  Topical anesthesia was administered to the operative eye.     The patient was then to the operative suite and placed in the supine position.  A timeout was performed confirming the patient, procedure to be performed, and all other relevant information.   The patient's face was prepped and draped in the usual fashion for intra-ocular surgery.  A lid speculum was placed into the operative eye and the surgical microscope moved into place and focused.  A superotemporal paracentesis was created using a 20 gauge paracentesis blade.  Shugarcaine was injected into the anterior chamber.  Viscoelastic was injected into the anterior chamber.  A temporal clear-corneal main wound incision was created using a 2.437mmicrokeratome.  A continuous curvilinear capsulorrhexis was initiated using an irrigating cystitome and completed using capsulorrhexis forceps.  Hydrodissection and hydrodeliniation were performed.  Viscoelastic was injected into the anterior chamber.  A phacoemulsification handpiece and a chopper as a second instrument were used to remove the nucleus and epinucleus. The irrigation/aspiration handpiece was  used to remove any remaining cortical material.   The capsular bag was reinflated with viscoelastic, checked, and found to be intact.  The intraocular lens was inserted into the capsular bag.  The irrigation/aspiration handpiece was used to remove any remaining viscoelastic.  The clear corneal wound and paracentesis wounds were then hydrated and checked with Weck-Cels to be watertight.  The lid-speculum was removed.  The drape was removed.  The patient's face was cleaned with a wet and dry 4x4.   Maxitrol was instilled in the eye. A clear shield was taped over the eye. The patient was taken to the post-operative care unit in good condition, having tolerated the procedure well.  Post-Op Instructions: [facility] and will receive all other orders and instructions.

## 2020-04-25 ENCOUNTER — Encounter (HOSPITAL_COMMUNITY): Payer: Self-pay | Admitting: Ophthalmology

## 2020-05-02 NOTE — H&P (Signed)
Surgical History & Physical  Patient Name: Terri Graham DOB: November 26, 1940  Surgery: Cataract extraction with intraocular lens implant phacoemulsification; Left Eye  Surgeon: Fabio Pierce MD Surgery Date:  05/08/2020 Pre-Op Date:  05/01/2020   HPI: A 18 Yr. old female patient PO-OD/Pre op OS The patient is returning after cataract surgery. The right eye is affected. Status post cataract surgery, which began 1 week ago: Since the last visit, the affected area is doing well. The patient's vision is improved and stable. Patient is following medication instructions. Omni BID OD. The patient experiences no increase in flashes, floater, shadow, curtain or veil. The patient complains of difficulty when driving due to glare from headlights or sun, which began many years ago. The left eye is affected. The episode is gradual. The condition's severity increased since last visit. This is negatively affecting her quality of life. HPI was performed by Fabio Pierce .  Medical History: Dry Eyes Glaucoma Cataracts High Blood Pressure Hypercholesterolemia  Review of Systems Allergic/Immunologic Seasonal Allergies All recorded systems are negative except as noted above.  Social   Never smoked  Medication Prednisolone-gatiflox-bromfenac,  Multivitamin, Rosuvastatin,   Sx/Procedures Phaco c IOL OD,  Tubal Ligation,   Drug Allergies  Seasonal allergies,   History & Physical: Heent:  Cataract, Left eye NECK: supple without bruits LUNGS: lungs clear to auscultation CV: regular rate and rhythm Abdomen: soft and non-tender  Impression & Plan: Assessment: 1.  CATARACT EXTRACTION STATUS; Right Eye (Z98.41) 2.  COMBINED FORMS AGE RELATED CATARACT; Left Eye (H25.812) 3.  NUCLEAR SCLEROSIS AGE RELATED; Left Eye (H25.12)  Plan: 1.  1 week after cataract surgery. Doing well with improved vision and normal eye pressure. Call with any problems or concerns. Continue Pred-Moxi-Brom 2x/day for 3  more weeks. 2.  Cataract accounts for the patient's decreased vision. This visual impairment is not correctable with a tolerable change in glasses or contact lenses. Cataract surgery with an implantation of a new lens should significantly improve the visual and functional status of the patient. Discussed all risks, benefits, alternatives, and potential complications. Discussed the procedures and recovery. Patient desires to have surgery. A-scan ordered and performed today for intra-ocular lens calculations. The surgery will be performed in order to improve vision for driving, reading, and for eye examinations. Recommend phacoemulsification with intra-ocular lens. Recommend Dextenza for post-operative pain and inflammation. Left Eye. Surgery required to correct imbalance of vision. Dilates well - shugarcaine by protocol.

## 2020-05-03 ENCOUNTER — Other Ambulatory Visit: Payer: Self-pay

## 2020-05-03 ENCOUNTER — Encounter (HOSPITAL_COMMUNITY)
Admission: RE | Admit: 2020-05-03 | Discharge: 2020-05-03 | Disposition: A | Discharge status: Home / Self Care | Payer: Medicare HMO | Source: Ambulatory Visit | Attending: Ophthalmology | Admitting: Ophthalmology

## 2020-05-05 ENCOUNTER — Other Ambulatory Visit: Payer: Self-pay

## 2020-05-05 ENCOUNTER — Other Ambulatory Visit (HOSPITAL_COMMUNITY)
Admission: RE | Admit: 2020-05-05 | Discharge: 2020-05-05 | Disposition: A | Discharge status: Home / Self Care | Payer: Medicare HMO | Source: Ambulatory Visit | Attending: Ophthalmology | Admitting: Ophthalmology

## 2020-05-05 DIAGNOSIS — Z01812 Encounter for preprocedural laboratory examination: Secondary | ICD-10-CM | POA: Diagnosis present

## 2020-05-05 DIAGNOSIS — Z20822 Contact with and (suspected) exposure to covid-19: Secondary | ICD-10-CM | POA: Diagnosis not present

## 2020-05-06 LAB — SARS CORONAVIRUS 2 (TAT 6-24 HRS): SARS Coronavirus 2: NEGATIVE

## 2020-05-08 ENCOUNTER — Other Ambulatory Visit: Payer: Self-pay

## 2020-05-08 ENCOUNTER — Ambulatory Visit (HOSPITAL_COMMUNITY): Payer: Medicare HMO | Admitting: Anesthesiology

## 2020-05-08 ENCOUNTER — Ambulatory Visit (HOSPITAL_COMMUNITY)
Admission: RE | Admit: 2020-05-08 | Discharge: 2020-05-08 | Disposition: A | Discharge status: Home / Self Care | Payer: Medicare HMO | Attending: Ophthalmology | Admitting: Ophthalmology

## 2020-05-08 ENCOUNTER — Encounter (HOSPITAL_COMMUNITY): Payer: Self-pay | Admitting: Ophthalmology

## 2020-05-08 ENCOUNTER — Encounter (HOSPITAL_COMMUNITY)
Admission: RE | Disposition: A | Discharge status: Home / Self Care | Payer: Self-pay | Source: Home / Self Care | Attending: Ophthalmology

## 2020-05-08 DIAGNOSIS — H2512 Age-related nuclear cataract, left eye: Secondary | ICD-10-CM | POA: Insufficient documentation

## 2020-05-08 DIAGNOSIS — H25812 Combined forms of age-related cataract, left eye: Secondary | ICD-10-CM | POA: Insufficient documentation

## 2020-05-08 DIAGNOSIS — Z9841 Cataract extraction status, right eye: Secondary | ICD-10-CM | POA: Insufficient documentation

## 2020-05-08 HISTORY — PX: CATARACT EXTRACTION W/PHACO: SHX586

## 2020-05-08 SURGERY — PHACOEMULSIFICATION, CATARACT, WITH IOL INSERTION
Anesthesia: Monitor Anesthesia Care | Site: Eye | Laterality: Left

## 2020-05-08 MED ORDER — POVIDONE-IODINE 5 % OP SOLN
OPHTHALMIC | Status: DC | PRN
  Administered 2020-05-08: 1 via OPHTHALMIC

## 2020-05-08 MED ORDER — LIDOCAINE HCL 3.5 % OP GEL
1.0000 "application " | Freq: Once | OPHTHALMIC | Status: AC
  Administered 2020-05-08: 1 via OPHTHALMIC

## 2020-05-08 MED ORDER — MIDAZOLAM HCL 2 MG/2ML IJ SOLN
INTRAMUSCULAR | Status: DC | PRN
  Administered 2020-05-08: 1 mg via INTRAVENOUS

## 2020-05-08 MED ORDER — LIDOCAINE HCL (PF) 1 % IJ SOLN
INTRAOCULAR | Status: DC | PRN
  Administered 2020-05-08: 1 mL via OPHTHALMIC

## 2020-05-08 MED ORDER — EPINEPHRINE PF 1 MG/ML IJ SOLN
INTRAOCULAR | Status: DC | PRN
  Administered 2020-05-08: 500 mL

## 2020-05-08 MED ORDER — NEOMYCIN-POLYMYXIN-DEXAMETH 3.5-10000-0.1 OP SUSP
OPHTHALMIC | Status: DC | PRN
  Administered 2020-05-08: 1 [drp] via OPHTHALMIC

## 2020-05-08 MED ORDER — SODIUM HYALURONATE 23 MG/ML IO SOLN
INTRAOCULAR | Status: DC | PRN
  Administered 2020-05-08: 0.6 mL via INTRAOCULAR

## 2020-05-08 MED ORDER — STERILE WATER FOR IRRIGATION IR SOLN
Status: DC | PRN
  Administered 2020-05-08: 250 mL

## 2020-05-08 MED ORDER — TETRACAINE HCL 0.5 % OP SOLN
1.0000 [drp] | OPHTHALMIC | Status: AC | PRN
  Administered 2020-05-08 (×3): 1 [drp] via OPHTHALMIC

## 2020-05-08 MED ORDER — TROPICAMIDE 1 % OP SOLN
1.0000 [drp] | OPHTHALMIC | Status: AC
  Administered 2020-05-08 (×3): 1 [drp] via OPHTHALMIC

## 2020-05-08 MED ORDER — BSS IO SOLN
INTRAOCULAR | Status: DC | PRN
  Administered 2020-05-08: 15 mL via INTRAOCULAR

## 2020-05-08 MED ORDER — EPINEPHRINE PF 1 MG/ML IJ SOLN
INTRAMUSCULAR | Status: AC
  Filled 2020-05-08: qty 2

## 2020-05-08 MED ORDER — MIDAZOLAM HCL 2 MG/2ML IJ SOLN
INTRAMUSCULAR | Status: AC
  Filled 2020-05-08: qty 2

## 2020-05-08 MED ORDER — SODIUM CHLORIDE 0.9% FLUSH
INTRAVENOUS | Status: DC | PRN
  Administered 2020-05-08: 5 mL via INTRAVENOUS

## 2020-05-08 MED ORDER — PROVISC 10 MG/ML IO SOLN
INTRAOCULAR | Status: DC | PRN
  Administered 2020-05-08: 0.85 mL via INTRAOCULAR

## 2020-05-08 MED ORDER — PHENYLEPHRINE HCL 2.5 % OP SOLN
1.0000 [drp] | OPHTHALMIC | Status: AC | PRN
  Administered 2020-05-08 (×3): 1 [drp] via OPHTHALMIC

## 2020-05-08 SURGICAL SUPPLY — 12 items
CLOTH BEACON ORANGE TIMEOUT ST (SAFETY) ×1 IMPLANT
EYE SHIELD UNIVERSAL CLEAR (GAUZE/BANDAGES/DRESSINGS) ×2 IMPLANT
GLOVE SURG UNDER POLY LF SZ6.5 (GLOVE) ×1 IMPLANT
GLOVE SURG UNDER POLY LF SZ7 (GLOVE) ×1 IMPLANT
NEEDLE HYPO 18GX1.5 BLUNT FILL (NEEDLE) ×2 IMPLANT
PAD ARMBOARD 7.5X6 YLW CONV (MISCELLANEOUS) ×2 IMPLANT
SYR TB 1ML LL NO SAFETY (SYRINGE) ×2 IMPLANT
TAPE SURG TRANSPORE 1 IN (GAUZE/BANDAGES/DRESSINGS) ×1 IMPLANT
TAPE SURGICAL TRANSPORE 1 IN (GAUZE/BANDAGES/DRESSINGS) ×2
TECNIS IOL (Intraocular Lens) ×1 IMPLANT
VISCOELASTIC ADDITIONAL (OPHTHALMIC RELATED) IMPLANT
WATER STERILE IRR 250ML POUR (IV SOLUTION) ×1 IMPLANT

## 2020-05-08 NOTE — Interval H&P Note (Signed)
History and Physical Interval Note:  05/08/2020 9:49 AM  Terri Graham  has presented today for surgery, with the diagnosis of Nuclear sclerotic cataract - Left eye.  The various methods of treatment have been discussed with the patient and family. After consideration of risks, benefits and other options for treatment, the patient has consented to  Procedure(s) with comments: CATARACT EXTRACTION PHACO AND INTRAOCULAR LENS PLACEMENT (IOC) (Left) - left as a surgical intervention.  The patient's history has been reviewed, patient examined, no change in status, stable for surgery.  I have reviewed the patient's chart and labs.  Questions were answered to the patient's satisfaction.     Fabio Pierce

## 2020-05-08 NOTE — Discharge Instructions (Signed)
Please discharge patient when stable, will follow up today with Dr. Tona Qualley at the [facility] office immediately following discharge.  Leave shield in place until visit.  All paperwork with discharge instructions will be given at the office.  [facility] Address:  [address]  [place], [place] [address]  

## 2020-05-08 NOTE — Anesthesia Postprocedure Evaluation (Signed)
Anesthesia Post Note  Patient: Terri Graham [facility]  Procedure(s) Performed: CATARACT EXTRACTION PHACO AND INTRAOCULAR LENS PLACEMENT (IOC) (Left Eye)  Patient location during evaluation: Short Stay Anesthesia Type: MAC Level of consciousness: awake and alert and oriented Pain management: pain level controlled Vital Signs Assessment: post-procedure vital signs reviewed and stable Respiratory status: spontaneous breathing Cardiovascular status: blood pressure returned to baseline and stable Postop Assessment: no apparent nausea or vomiting Anesthetic complications: no   No complications documented.   Last Vitals:  Vitals:   05/08/20 0910 05/08/20 1015  BP:  (!) 182/92  Pulse: 72 70  Resp: 13 16  Temp: 36.7 C 36.7 C  SpO2: 95% 95%    Last Pain:  Vitals:   05/08/20 1015  TempSrc: Oral  PainSc: 0-No pain                 Terri Graham

## 2020-05-08 NOTE — Transfer of Care (Signed)
Immediate Anesthesia Transfer of Care Note  Patient: Terri Graham [facility]  Procedure(s) Performed: CATARACT EXTRACTION PHACO AND INTRAOCULAR LENS PLACEMENT (IOC) (Left Eye)  Patient Location: Short Stay  Anesthesia Type:MAC  Level of Consciousness: awake  Airway & Oxygen Therapy: Patient Spontanous Breathing  Post-op Assessment: Report given to RN  Post vital signs: Reviewed  Last Vitals:  Vitals Value Taken Time  BP    Temp    Pulse    Resp    SpO2      Last Pain:  Vitals:   05/08/20 0910  TempSrc: Oral  PainSc: 0-No pain         Complications: No complications documented.

## 2020-05-08 NOTE — Op Note (Signed)
Date of procedure: 05/08/20  Pre-operative diagnosis: Visually significant age-related combined cataract, Left Eye (H25.812)  Post-operative diagnosis: Visually significant age-related combined cataract, Left Eye (H25.812)  Procedure: Removal of cataract via phacoemulsification and insertion of intra-ocular lens [employer] [id number]  +21.0D into the capsular bag of the Left Eye  Attending surgeon: Gerda Diss. Jaymie Mckiddy, MD, MA  Anesthesia: MAC, Topical Akten  Complications: None  Estimated Blood Loss: <13m (minimal)  Specimens: None  Implants: As above  Indications:  Visually significant age-related cataract, Left Eye  Procedure:  The patient was seen and identified in the pre-operative area. The operative eye was identified and dilated.  The operative eye was marked.  Topical anesthesia was administered to the operative eye.     The patient was then to the operative suite and placed in the supine position.  A timeout was performed confirming the patient, procedure to be performed, and all other relevant information.   The patient's face was prepped and draped in the usual fashion for intra-ocular surgery.  A lid speculum was placed into the operative eye and the surgical microscope moved into place and focused.  An inferotemporal paracentesis was created using a 20 gauge paracentesis blade.  Shugarcaine was injected into the anterior chamber.  Viscoelastic was injected into the anterior chamber.  A temporal clear-corneal main wound incision was created using a 2.45mmicrokeratome.  A continuous curvilinear capsulorrhexis was initiated using an irrigating cystitome and completed using capsulorrhexis forceps.  Hydrodissection and hydrodeliniation were performed.  Viscoelastic was injected into the anterior chamber.  A phacoemulsification handpiece and a chopper as a second instrument were used to remove the nucleus and epinucleus. The irrigation/aspiration handpiece was used to remove  any remaining cortical material.   The capsular bag was reinflated with viscoelastic, checked, and found to be intact.  The intraocular lens was inserted into the capsular bag.  The irrigation/aspiration handpiece was used to remove any remaining viscoelastic.  The clear corneal wound and paracentesis wounds were then hydrated and checked with Weck-Cels to be watertight.  The lid-speculum was removed.  The drape was removed.  The patient's face was cleaned with a wet and dry 4x4.   Maxitrol was instilled in the eye. A clear shield was taped over the eye. The patient was taken to the post-operative care unit in good condition, having tolerated the procedure well.  Post-Op Instructions: [facility] and will receive all other orders and instructions.

## 2020-05-08 NOTE — Anesthesia Preprocedure Evaluation (Signed)
Anesthesia Evaluation  Patient identified by MRN, date of birth, ID band Patient awake    Reviewed: Allergy & Precautions, NPO status , Patient's Chart, lab work & pertinent test results  History of Anesthesia Complications Negative for: history of anesthetic complications  Airway Mallampati: II  TM Distance: >3 FB Neck ROM: Full    Dental  (+) Dental Advisory Given, Teeth Intact   Pulmonary neg pulmonary ROS,    Pulmonary exam normal breath sounds clear to auscultation       Cardiovascular Exercise Tolerance: Good hypertension, Normal cardiovascular exam Rhythm:Regular Rate:Normal     Neuro/Psych negative neurological ROS     GI/Hepatic negative GI ROS, Neg liver ROS,   Endo/Other  negative endocrine ROS  Renal/GU negative Renal ROS     Musculoskeletal negative musculoskeletal ROS (+)   Abdominal   Peds  Hematology negative hematology ROS (+)   Anesthesia Other Findings   Reproductive/Obstetrics negative OB ROS                             Anesthesia Physical  Anesthesia Plan  ASA: II  Anesthesia Plan: MAC   Post-op Pain Management:    Induction: Intravenous  PONV Risk Score and Plan:   Airway Management Planned: Natural Airway  Additional Equipment:   Intra-op Plan:   Post-operative Plan:   Informed Consent: I have reviewed the patients History and Physical, chart, labs and discussed the procedure including the risks, benefits and alternatives for the proposed anesthesia with the patient or authorized representative who has indicated his/her understanding and acceptance.     Dental advisory given  Plan Discussed with: CRNA and Surgeon  Anesthesia Plan Comments:         Anesthesia Quick Evaluation

## 2020-05-09 ENCOUNTER — Encounter (HOSPITAL_COMMUNITY): Payer: Self-pay | Admitting: Ophthalmology

## 2020-06-20 IMAGING — CT CT HEAD W/O CM
3 series · 15 of 47 positions shown, 18 images · non-contrast
Comparison: None.

CLINICAL DATA: Emesis beginning at 4 p.m.

EXAM:
CT HEAD WITHOUT CONTRAST
TECHNIQUE: Contiguous axial images were obtained from the base of the skull
through the vertex without intravenous contrast.

[Series 2: head w o · axial · 0.42mm/px · z∈[+196,+321]mm · 9 of 30 slices shown, 12 images]
[im 3/30  brain]
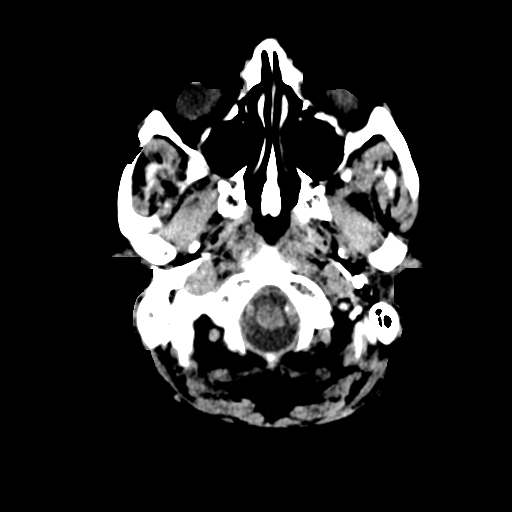
[im 3/30  bone]
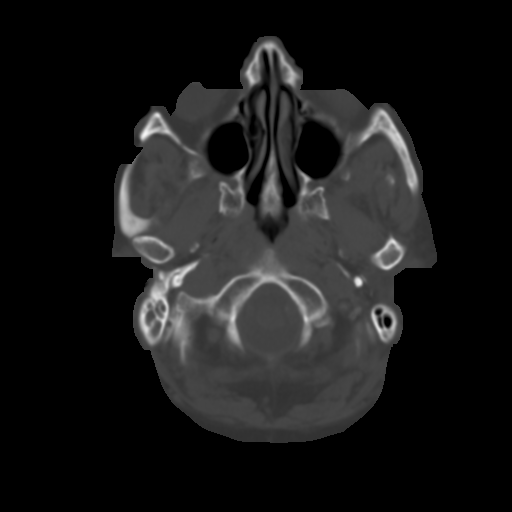
[im 6/30  brain]
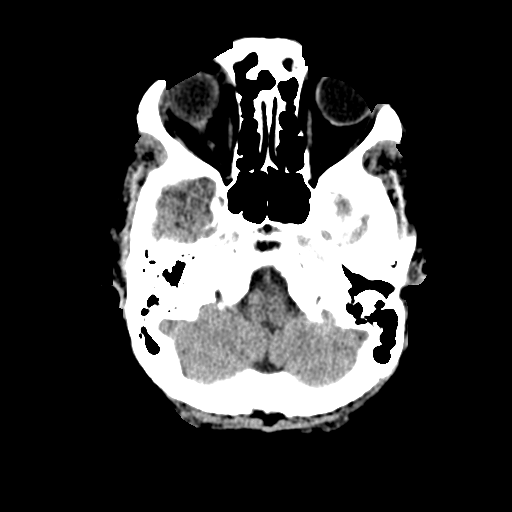
[im 9/30  brain]
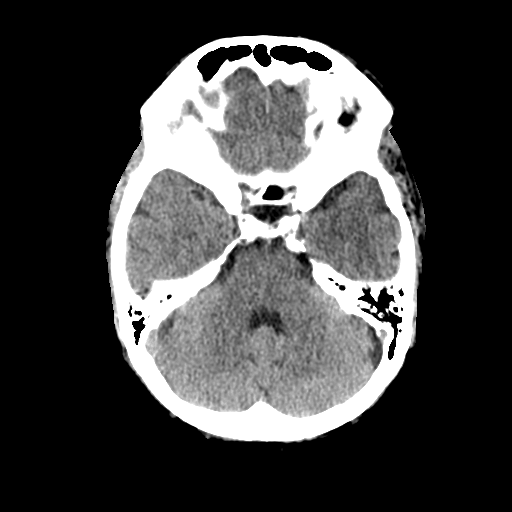
[im 12/30  brain]
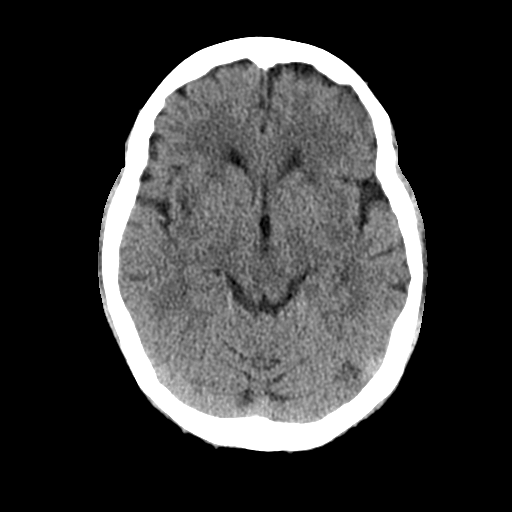
[im 16/30  brain]
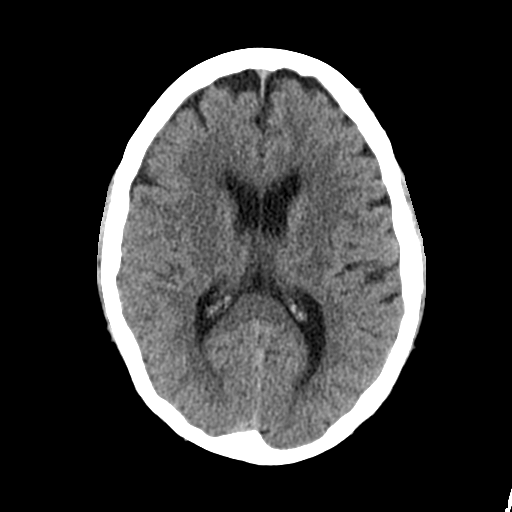
[im 16/30  bone]
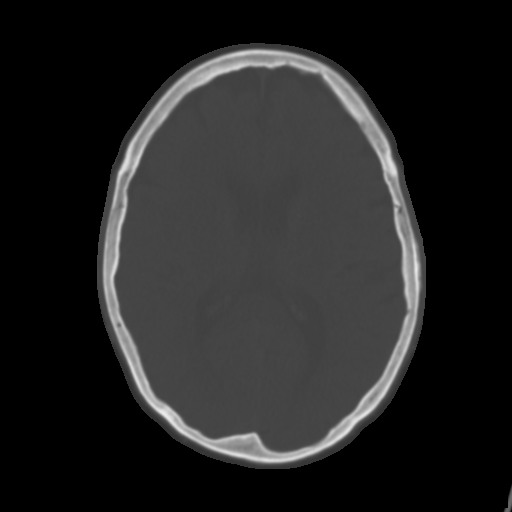
[im 19/30  brain]
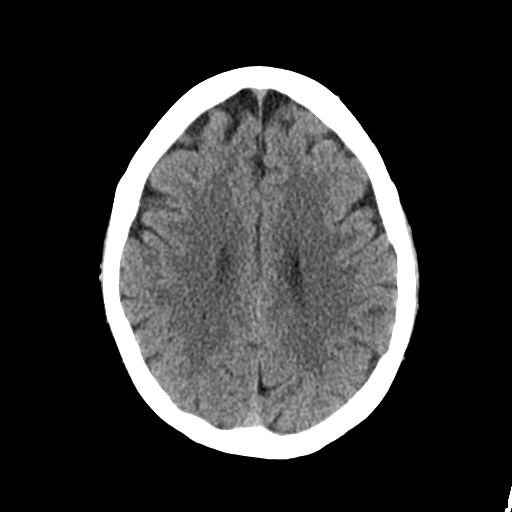
[im 22/30  brain]
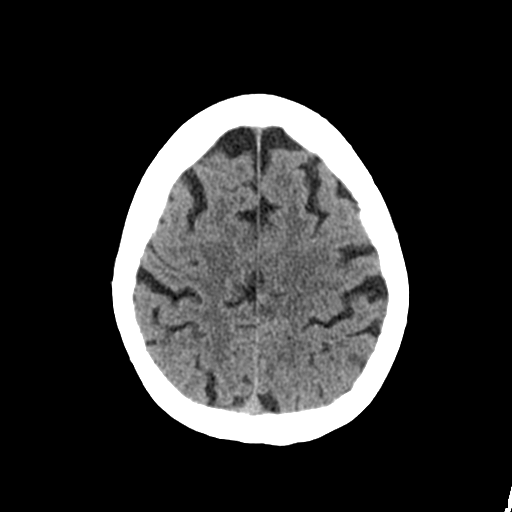
[im 25/30  brain]
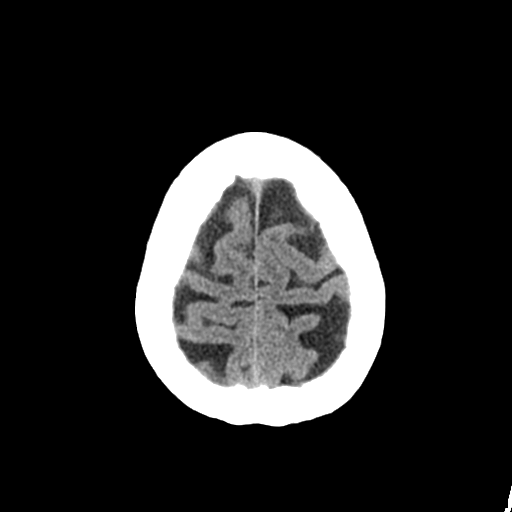
[im 28/30  brain]
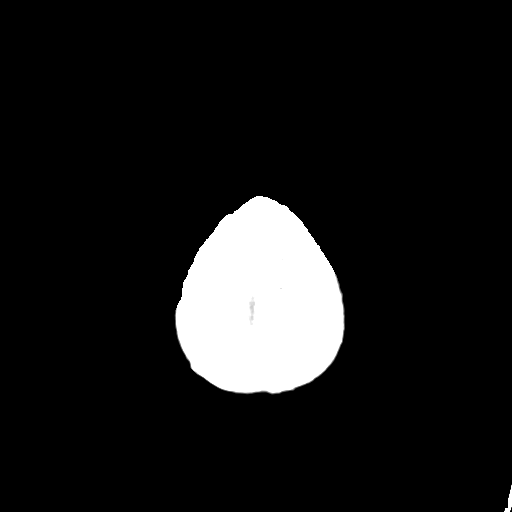
[im 28/30  bone]
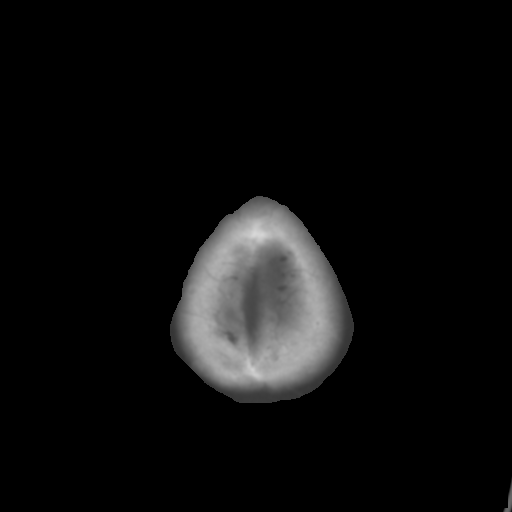

[Series 4: coronal soft · coronal · 0.35mm/px · 3 of 79 slices shown]
[im 27/79  brain]
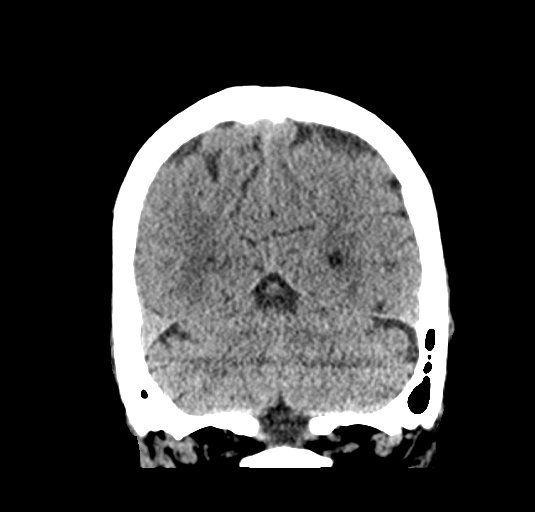
[im 35/79  brain]
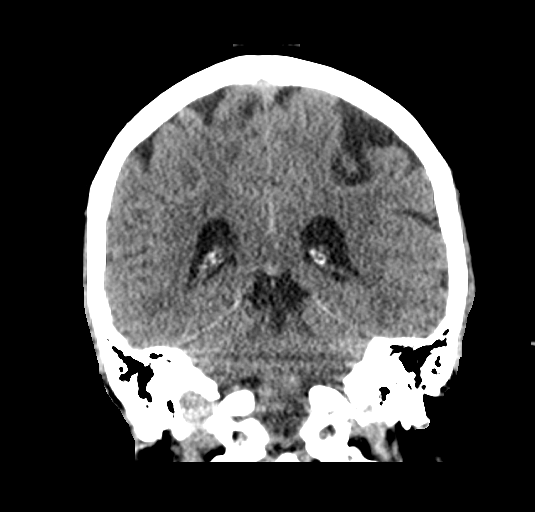
[im 44/79  brain]
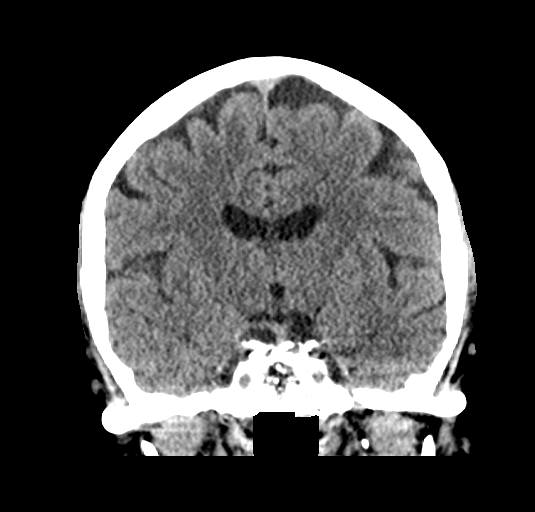

[Series 5: sagittal soft · sagittal · 0.34mm/px · 3 of 62 slices shown]
[im 21/62  brain]
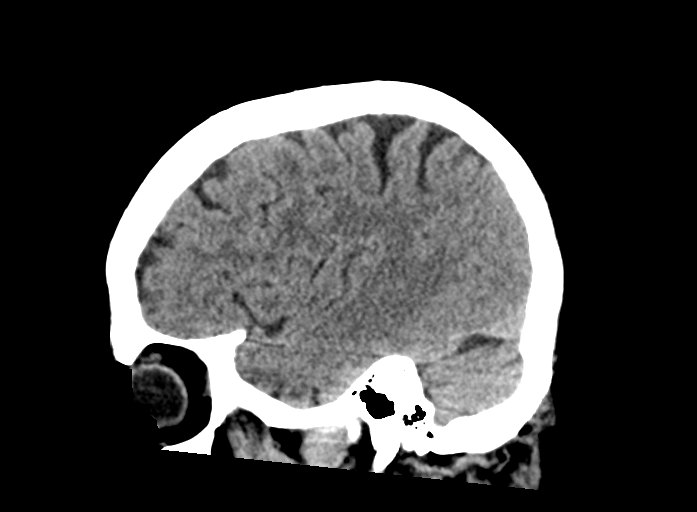
[im 31/62  brain]
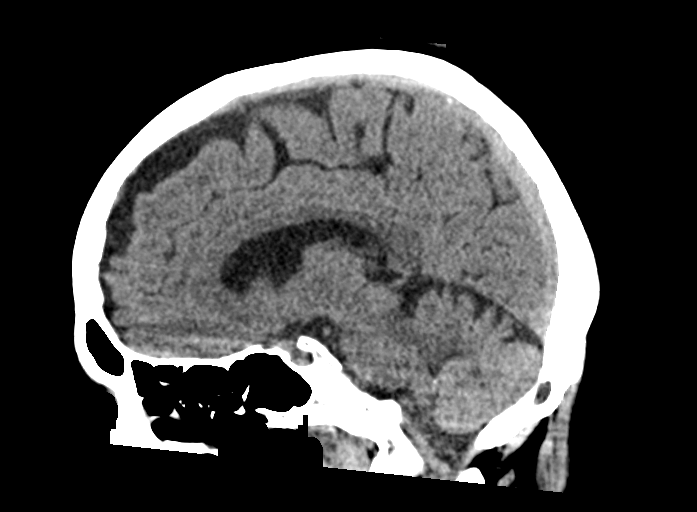
[im 41/62  brain]
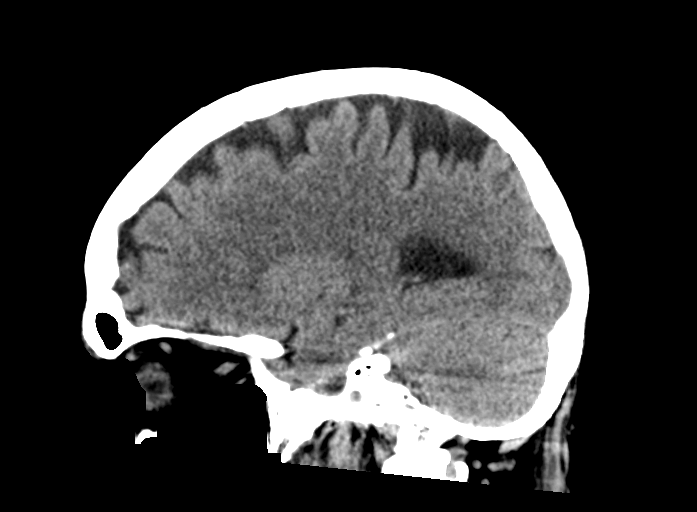

[15 of 47 positions shown; findings below may reference images not displayed]

FINDINGS: Brain: Likely remote infarct or prominent perivascular space in
posterior left putamen. No evidence of acute infarction, hemorrhage,
hydrocephalus, extra-axial collection or mass lesion/mass effect.
Symmetric prominence of the ventricles, cisterns and sulci
compatible with parenchymal volume loss. Patchy areas of white
matter hypoattenuation are most compatible with chronic
microvascular angiopathy.

Vascular: Atherosclerotic calcification of the carotid siphons and
intradural vertebral arteries. No hyperdense vessel.

Skull: No calvarial fracture or suspicious osseous lesion. No scalp
swelling or hematoma.

Sinuses/Orbits: Trace right mastoid effusion. Paranasal sinuses and
mastoid air cells are otherwise predominantly clear. Included
orbital structures are unremarkable.

Other: None
IMPRESSION: Remote infarct versus Virchow Robin space in the posterior left
putamen.

No acute intracranial abnormality.

Trace right mastoid effusion.

## 2021-12-16 ENCOUNTER — Other Ambulatory Visit: Payer: Self-pay

## 2021-12-16 ENCOUNTER — Encounter: Payer: Self-pay | Admitting: Emergency Medicine

## 2021-12-16 ENCOUNTER — Ambulatory Visit
Admission: EM | Admit: 2021-12-16 | Discharge: 2021-12-16 | Disposition: A | Discharge status: Home / Self Care | Payer: Medicare HMO | Attending: Family Medicine | Admitting: Family Medicine

## 2021-12-16 DIAGNOSIS — R5383 Other fatigue: Secondary | ICD-10-CM | POA: Diagnosis not present

## 2021-12-16 DIAGNOSIS — R030 Elevated blood-pressure reading, without diagnosis of hypertension: Secondary | ICD-10-CM | POA: Diagnosis not present

## 2021-12-16 LAB — POCT URINALYSIS DIP (MANUAL ENTRY)
Bilirubin, UA: NEGATIVE
Glucose, UA: NEGATIVE mg/dL
Ketones, POC UA: NEGATIVE mg/dL
Leukocytes, UA: NEGATIVE
Nitrite, UA: NEGATIVE
Protein Ur, POC: 30 mg/dL — AB
Spec Grav, UA: 1.02 (ref 1.010–1.025)
Urobilinogen, UA: 1 E.U./dL
pH, UA: 7 (ref 5.0–8.0)

## 2021-12-16 LAB — POCT FASTING CBG KUC MANUAL ENTRY: POCT Glucose (KUC): 119 mg/dL — AB (ref 70–99)

## 2021-12-16 NOTE — ED Triage Notes (Signed)
Pt reports fatigue and generalized weakness x1 weak. Denies pain, shortness of breath, dizziness.

## 2021-12-16 NOTE — ED Provider Notes (Addendum)
[facility]    CSN: [id number] Arrival date & time: 12/16/21  1236      History   Chief Complaint Chief Complaint  Patient presents with   Fatigue    HPI Terri Graham is a 81 y.o. female.   Patient presenting today with generalized fatigue and weakness for about a week.  States she noticed last Saturday that she had no energy and "just felt completely worn out ".  She and her daughter who is with her today states she is usually very high energy and spunky but has not been that way for about 8 days now.  Some decrease in appetite initially but this has returned back to normal.  Denies fever, chills, body aches, headache, visual change, nausea vomiting or diarrhea, rashes, chest pain, shortness of breath, palpitations, dizziness.  Not trying any medications over-the-counter for symptoms.  Compliant with her home medications.    Past Medical History:  Diagnosis Date   Constipation 07/29/2012   Hypertension    Rectocele 07/29/2012    Patient Active Problem List   Diagnosis Date Noted   Screening for colorectal cancer 08/13/2017   Encounter for well woman exam with routine gynecological exam 08/13/2017   Hypertension 07/29/2012   Rectocele 07/29/2012   Constipation 07/29/2012    Past Surgical History:  Procedure Laterality Date   CATARACT EXTRACTION W/PHACO Right 04/24/2020   Procedure: CATARACT EXTRACTION PHACO AND INTRAOCULAR LENS PLACEMENT (IOC);  Surgeon: Fabio Pierce, MD;  Location: [facility];  Service: Ophthalmology;  Laterality: Right;  CDE: 6.89   CATARACT EXTRACTION W/PHACO Left 05/08/2020   Procedure: CATARACT EXTRACTION PHACO AND INTRAOCULAR LENS PLACEMENT (IOC);  Surgeon: Fabio Pierce, MD;  Location: [facility];  Service: Ophthalmology;  Laterality: Left;  CDE: 10.74   COLONOSCOPY  08/22/2011   Procedure: COLONOSCOPY;  Surgeon: Malissa Hippo, MD;  Location: [facility];  Service: Endoscopy;  Laterality: N/A;  1030   TUBAL LIGATION      OB History      Gravida  2   Para  2   Term  2   Preterm      AB      Living  2      SAB      IAB      Ectopic      Multiple      Live Births  2            Home Medications    Prior to Admission medications   Medication Sig Start Date End Date Taking? Authorizing Provider  Ascorbic Acid (VITAMIN C PO) Take 2,000 mg by mouth daily.    [provider]  aspirin EC 81 MG tablet Take 81 mg by mouth every evening.    [provider]  Cholecalciferol (VITAMIN D) 125 MCG (5000 UT) CAPS Take 5,000 Units by mouth daily.    [provider]  Garlic 1000 MG CAPS Take 1,000 mg by mouth daily.    [provider]  Noni CAPS Take 1 capsule by mouth daily.    [provider]  Omega-3 Fatty Acids (FISH OIL ULTRA) 1400 MG CAPS Take 1,400 mg by mouth daily.    [provider]  omeprazole (PRILOSEC OTC) 20 MG tablet Take 20 mg by mouth daily as needed (acid reflux).    [provider]  rosuvastatin (CRESTOR) 40 MG tablet Take 40 mg by mouth every evening.    [provider]  vitamin E 180 MG (400  UNITS) capsule Take 400 Units by mouth daily.    [provider]    Family History Family History  Problem Relation Age of Onset   Heart disease Mother    Heart disease Father    Cancer Sister        breast   Heart disease Brother    Diabetes Daughter    Diabetes Maternal Grandmother    Dementia Brother    Colon cancer Neg Hx     Social History Social History   Tobacco Use   Smoking status: Never   Smokeless tobacco: Never  Vaping Use   Vaping Use: Never used  Substance Use Topics   Alcohol use: Yes    Comment: wine socially   Drug use: No     Allergies   Patient has no known allergies.   Review of Systems Review of Systems Per HPI  Physical Exam Triage Vital Signs ED Triage Vitals  Enc Vitals Group     BP 12/16/21 1305 (!) 192/94     Pulse Rate 12/16/21 1305 88     Resp 12/16/21 1305 15      Temp 12/16/21 1305 98.3 F (36.8 C)     Temp Source 12/16/21 1305 Oral     SpO2 12/16/21 1305 91 %     Weight --      Height --      Head Circumference --      Peak Flow --      Pain Score 12/16/21 1316 0     Pain Loc --      Pain Edu? --      Excl. in GC? --    No data found.  Updated Vital Signs BP (!) 192/94 (BP Location: Right Arm)   Pulse 88   Temp 98.3 F (36.8 C) (Oral)   Resp 15   SpO2 91%   Visual Acuity Right Eye Distance:   Left Eye Distance:   Bilateral Distance:    Right Eye Near:   Left Eye Near:    Bilateral Near:     Physical Exam Vitals and nursing note reviewed.  Constitutional:      Appearance: Normal appearance. She is not ill-appearing.  HENT:     Head: Atraumatic.     Mouth/Throat:     Mouth: Mucous membranes are moist.  Eyes:     Extraocular Movements: Extraocular movements intact.     Conjunctiva/sclera: Conjunctivae normal.     Pupils: Pupils are equal, round, and reactive to light.  Cardiovascular:     Rate and Rhythm: Normal rate and regular rhythm.     Heart sounds: Normal heart sounds.  Pulmonary:     Effort: Pulmonary effort is normal.     Breath sounds: Normal breath sounds.  Musculoskeletal:        General: Normal range of motion.     Cervical back: Normal range of motion and neck supple.  Skin:    General: Skin is warm and dry.  Neurological:     General: No focal deficit present.     Mental Status: She is alert and oriented to person, place, and time.     Motor: No weakness.     Gait: Gait normal.  Psychiatric:        Mood and Affect: Mood normal.        Thought Content: Thought content normal.        Judgment: Judgment normal.      UC Treatments / Results  Labs (  all labs ordered are listed, but only abnormal results are displayed) Labs Reviewed  POCT FASTING CBG [facility] MANUAL ENTRY - Abnormal; Notable for the following components:      Result Value   POCT Glucose ([facility]) 119 (*)    All other components within  normal limits  POCT URINALYSIS DIP (MANUAL ENTRY) - Abnormal; Notable for the following components:   Blood, UA trace-intact (*)    Protein Ur, POC =30 (*)    All other components within normal limits  CBC WITH DIFFERENTIAL/PLATELET  COMPREHENSIVE METABOLIC PANEL  TSH    EKG   Radiology No results found.  Procedures Procedures (including critical care time)  Medications Ordered in UC Medications - No data to display  Initial Impression / Assessment and Plan / UC Course  I have reviewed the triage vital signs and the nursing notes.  Pertinent labs & imaging results that were available during my care of the patient were reviewed by me and considered in my medical decision making (see chart for details).     Hypertensive in triage, otherwise vital signs benign and reassuring, exam overall reassuring with no focal deficits.  She is well-appearing and in no acute distress.  Her urinalysis today and point-of-care glucose are both reassuring, labs pending, EKG showing normal sinus rhythm at 82 bpm without acute ST or T wave changes and appearing consistent with previous EKG in chart.  Possibly related to blood pressure elevations, however discussed to follow-up tomorrow morning with primary care for further discussion of this.  Will call with any lab abnormalities and go to the emergency department if worsening symptoms.   50 minutes spent today in direct patient care, work-up, counseling and education Final Clinical Impressions(s) / UC Diagnoses   Final diagnoses:  Other fatigue  Elevated blood pressure reading   Discharge Instructions   None    ED Prescriptions   None    PDMP not reviewed this encounter.   Volney American, [place] 12/16/21 [place], [place], PA-C 12/16/21 [place], [place], [place] 12/16/21 1426

## 2021-12-18 LAB — CBC WITH DIFFERENTIAL/PLATELET
Basophils Absolute: 0 10*3/uL (ref 0.0–0.2)
Basos: 0 %
EOS (ABSOLUTE): 0.1 10*3/uL (ref 0.0–0.4)
Eos: 1 %
Hematocrit: 40.9 % (ref 34.0–46.6)
Hemoglobin: 13.8 g/dL (ref 11.1–15.9)
Immature Grans (Abs): 0 10*3/uL (ref 0.0–0.1)
Immature Granulocytes: 0 %
Lymphocytes Absolute: 0.8 10*3/uL (ref 0.7–3.1)
Lymphs: 16 %
MCH: 31 pg (ref 26.6–33.0)
MCHC: 33.7 g/dL (ref 31.5–35.7)
MCV: 92 fL (ref 79–97)
Monocytes Absolute: 0.5 10*3/uL (ref 0.1–0.9)
Monocytes: 10 %
Neutrophils Absolute: 3.7 10*3/uL (ref 1.4–7.0)
Neutrophils: 73 %
Platelets: 327 10*3/uL (ref 150–450)
RBC: 4.45 x10E6/uL (ref 3.77–5.28)
RDW: 13 % (ref 11.7–15.4)
WBC: 5.1 10*3/uL (ref 3.4–10.8)

## 2021-12-18 LAB — COMPREHENSIVE METABOLIC PANEL
ALT: 17 IU/L (ref 0–32)
AST: 19 IU/L (ref 0–40)
Albumin/Globulin Ratio: 1.8 (ref 1.2–2.2)
Albumin: 4.7 g/dL (ref 3.7–4.7)
Alkaline Phosphatase: 54 IU/L (ref 44–121)
BUN/Creatinine Ratio: 15 (ref 12–28)
BUN: 14 mg/dL (ref 8–27)
Bilirubin Total: 0.4 mg/dL (ref 0.0–1.2)
CO2: 21 mmol/L (ref 20–29)
Calcium: 10 mg/dL (ref 8.7–10.3)
Chloride: 104 mmol/L (ref 96–106)
Creatinine, Ser: 0.93 mg/dL (ref 0.57–1.00)
Globulin, Total: 2.6 g/dL (ref 1.5–4.5)
Glucose: 94 mg/dL (ref 70–99)
Potassium: 4.3 mmol/L (ref 3.5–5.2)
Sodium: 141 mmol/L (ref 134–144)
Total Protein: 7.3 g/dL (ref 6.0–8.5)
eGFR: 62 mL/min/{1.73_m2} (ref >59)

## 2021-12-18 LAB — TSH: TSH: 0.736 u[IU]/mL (ref 0.450–4.500)
